# Patient Record
Sex: Male | Born: 1945 | Race: White | Hispanic: No | Marital: Married | Smoking: Former smoker
Health system: Southern US, Community
[De-identification: ages and names within clinical notes are randomized; demographics above are authoritative.]

## PROBLEM LIST (undated history)

## (undated) DIAGNOSIS — T148XXA Other injury of unspecified body region, initial encounter: Secondary | ICD-10-CM

## (undated) DIAGNOSIS — I2109 ST elevation (STEMI) myocardial infarction involving other coronary artery of anterior wall: Secondary | ICD-10-CM

## (undated) DIAGNOSIS — F329 Major depressive disorder, single episode, unspecified: Secondary | ICD-10-CM

## (undated) DIAGNOSIS — I519 Heart disease, unspecified: Secondary | ICD-10-CM

## (undated) DIAGNOSIS — F32A Depression, unspecified: Secondary | ICD-10-CM

## (undated) DIAGNOSIS — I2510 Atherosclerotic heart disease of native coronary artery without angina pectoris: Secondary | ICD-10-CM

## (undated) HISTORY — PX: CORONARY ARTERY BYPASS GRAFT: SHX141

## (undated) HISTORY — DX: Atherosclerotic heart disease of native coronary artery without angina pectoris: I25.10

## (undated) HISTORY — PX: CARDIAC CATHETERIZATION: SHX172

## (undated) HISTORY — DX: Depression, unspecified: F32.A

## (undated) HISTORY — DX: Heart disease, unspecified: I51.9

## (undated) HISTORY — PX: TONSILLECTOMY: SUR1361

## (undated) HISTORY — DX: Major depressive disorder, single episode, unspecified: F32.9

## (undated) HISTORY — DX: ST elevation (STEMI) myocardial infarction involving other coronary artery of anterior wall: I21.09

## (undated) HISTORY — PX: OTHER SURGICAL HISTORY: SHX169

## (undated) HISTORY — DX: Other injury of unspecified body region, initial encounter: T14.8XXA

---

## 2000-06-17 ENCOUNTER — Other Ambulatory Visit: Admission: RE | Admit: 2000-06-17 | Discharge: 2000-06-17 | Payer: Self-pay | Admitting: Gastroenterology

## 2010-08-19 ENCOUNTER — Inpatient Hospital Stay (HOSPITAL_COMMUNITY)
Admission: EM | Admit: 2010-08-19 | Discharge: 2010-08-24 | DRG: 107 | Disposition: A | Discharge status: Home / Self Care | Payer: BC Managed Care – PPO | Attending: Family Medicine | Admitting: Family Medicine

## 2010-08-19 DIAGNOSIS — E785 Hyperlipidemia, unspecified: Secondary | ICD-10-CM | POA: Diagnosis present

## 2010-08-19 DIAGNOSIS — R110 Nausea: Secondary | ICD-10-CM | POA: Diagnosis not present

## 2010-08-19 DIAGNOSIS — I2510 Atherosclerotic heart disease of native coronary artery without angina pectoris: Secondary | ICD-10-CM

## 2010-08-19 DIAGNOSIS — D62 Acute posthemorrhagic anemia: Secondary | ICD-10-CM | POA: Diagnosis not present

## 2010-08-19 DIAGNOSIS — I2109 ST elevation (STEMI) myocardial infarction involving other coronary artery of anterior wall: Principal | ICD-10-CM | POA: Diagnosis present

## 2010-08-19 DIAGNOSIS — F1010 Alcohol abuse, uncomplicated: Secondary | ICD-10-CM | POA: Diagnosis present

## 2010-08-19 DIAGNOSIS — I10 Essential (primary) hypertension: Secondary | ICD-10-CM | POA: Diagnosis present

## 2010-08-19 DIAGNOSIS — Z01810 Encounter for preprocedural cardiovascular examination: Secondary | ICD-10-CM

## 2010-08-19 DIAGNOSIS — D696 Thrombocytopenia, unspecified: Secondary | ICD-10-CM | POA: Diagnosis not present

## 2010-08-19 DIAGNOSIS — E8779 Other fluid overload: Secondary | ICD-10-CM | POA: Diagnosis not present

## 2010-08-19 DIAGNOSIS — Z79899 Other long term (current) drug therapy: Secondary | ICD-10-CM

## 2010-08-19 DIAGNOSIS — Z8249 Family history of ischemic heart disease and other diseases of the circulatory system: Secondary | ICD-10-CM

## 2010-08-19 HISTORY — DX: ST elevation (STEMI) myocardial infarction involving other coronary artery of anterior wall: I21.09

## 2010-08-19 LAB — CBC
HCT: 42.4 % (ref 39.0–52.0)
MCHC: 34.2 g/dL (ref 30.0–36.0)
Platelets: 169 10*3/uL (ref 150–400)
RDW: 13.5 % (ref 11.5–15.5)
WBC: 7.4 10*3/uL (ref 4.0–10.5)

## 2010-08-19 LAB — CARDIAC PANEL(CRET KIN+CKTOT+MB+TROPI)
CK, MB: 88.5 ng/mL (ref 0.3–4.0)
Total CK: 687 U/L — ABNORMAL HIGH (ref 7–232)
Troponin I: 6.25 ng/mL (ref 0.00–0.06)

## 2010-08-19 LAB — COMPREHENSIVE METABOLIC PANEL
Alkaline Phosphatase: 77 U/L (ref 39–117)
BUN: 7 mg/dL (ref 6–23)
Creatinine, Ser: 1.1 mg/dL (ref 0.4–1.5)
Glucose, Bld: 111 mg/dL — ABNORMAL HIGH (ref 70–99)
Potassium: 3.8 mEq/L (ref 3.5–5.1)
Total Protein: 6.5 g/dL (ref 6.0–8.3)

## 2010-08-19 LAB — BLOOD GAS, ARTERIAL
Drawn by: 23590
O2 Saturation: 98.1 %
Patient temperature: 98.6

## 2010-08-19 LAB — URINALYSIS, ROUTINE W REFLEX MICROSCOPIC
Protein, ur: NEGATIVE mg/dL
Urobilinogen, UA: 0.2 mg/dL (ref 0.0–1.0)

## 2010-08-19 LAB — BRAIN NATRIURETIC PEPTIDE: Pro B Natriuretic peptide (BNP): 195 pg/mL — ABNORMAL HIGH (ref 0.0–100.0)

## 2010-08-19 LAB — HEMOGLOBIN A1C
Hgb A1c MFr Bld: 5.3 % (ref <5.7)
Mean Plasma Glucose: 105 mg/dL (ref <117)

## 2010-08-19 LAB — PROTIME-INR
INR: 1.06 (ref 0.00–1.49)
Prothrombin Time: 14 seconds (ref 11.6–15.2)

## 2010-08-19 LAB — APTT: aPTT: 181 seconds — ABNORMAL HIGH (ref 24–37)

## 2010-08-19 LAB — MRSA PCR SCREENING: MRSA by PCR: NEGATIVE

## 2010-08-20 ENCOUNTER — Inpatient Hospital Stay (HOSPITAL_COMMUNITY): Payer: BC Managed Care – PPO

## 2010-08-20 DIAGNOSIS — I2510 Atherosclerotic heart disease of native coronary artery without angina pectoris: Secondary | ICD-10-CM

## 2010-08-20 LAB — POCT I-STAT 3, ART BLOOD GAS (G3+)
Acid-base deficit: 2 mmol/L (ref 0.0–2.0)
Acid-base deficit: 4 mmol/L — ABNORMAL HIGH (ref 0.0–2.0)
Acid-base deficit: 1 mmol/L (ref 0.0–2.0)
Bicarbonate: 23 mEq/L (ref 20.0–24.0)
Bicarbonate: 24.5 mEq/L — ABNORMAL HIGH (ref 20.0–24.0)
O2 Saturation: 100 %
O2 Saturation: 99 %
O2 Saturation: 98 %
O2 Saturation: 97 %
O2 Saturation: 97 %
Patient temperature: 35.3
TCO2: 27 mmol/L (ref 0–100)
TCO2: 25 mmol/L (ref 0–100)
TCO2: 23 mmol/L (ref 0–100)
TCO2: 24 mmol/L (ref 0–100)
pCO2 arterial: 31.3 mmHg — ABNORMAL LOW (ref 35.0–45.0)
pCO2 arterial: 36.7 mmHg (ref 35.0–45.0)
pCO2 arterial: 49.2 mmHg — ABNORMAL HIGH (ref 35.0–45.0)
pCO2 arterial: 45.3 mmHg — ABNORMAL HIGH (ref 35.0–45.0)
pH, Arterial: 7.451 — ABNORMAL HIGH (ref 7.350–7.450)
pO2, Arterial: 277 mmHg — ABNORMAL HIGH (ref 80.0–100.0)
pO2, Arterial: 100 mmHg (ref 80.0–100.0)
pO2, Arterial: 100 mmHg (ref 80.0–100.0)

## 2010-08-20 LAB — POCT I-STAT, CHEM 8
BUN: 8 mg/dL (ref 6–23)
BUN: 9 mg/dL (ref 6–23)
Calcium, Ion: 1.14 mmol/L (ref 1.12–1.32)
Chloride: 107 mEq/L (ref 96–112)
Chloride: 108 mEq/L (ref 96–112)
Creatinine, Ser: 1.1 mg/dL (ref 0.4–1.5)
Glucose, Bld: 117 mg/dL — ABNORMAL HIGH (ref 70–99)
Glucose, Bld: 115 mg/dL — ABNORMAL HIGH (ref 70–99)
Potassium: 3.8 mEq/L (ref 3.5–5.1)
Potassium: 4.8 mEq/L (ref 3.5–5.1)

## 2010-08-20 LAB — POCT I-STAT 4, (NA,K, GLUC, HGB,HCT)
Glucose, Bld: 113 mg/dL — ABNORMAL HIGH (ref 70–99)
Glucose, Bld: 101 mg/dL — ABNORMAL HIGH (ref 70–99)
Glucose, Bld: 92 mg/dL (ref 70–99)
HCT: 34 % — ABNORMAL LOW (ref 39.0–52.0)
HCT: 27 % — ABNORMAL LOW (ref 39.0–52.0)
Hemoglobin: 10.9 g/dL — ABNORMAL LOW (ref 13.0–17.0)
Hemoglobin: 9.2 g/dL — ABNORMAL LOW (ref 13.0–17.0)
Hemoglobin: 11.2 g/dL — ABNORMAL LOW (ref 13.0–17.0)
Potassium: 3.9 mEq/L (ref 3.5–5.1)
Potassium: 5.5 mEq/L — ABNORMAL HIGH (ref 3.5–5.1)
Potassium: 3.9 mEq/L (ref 3.5–5.1)
Sodium: 141 mEq/L (ref 135–145)
Sodium: 137 mEq/L (ref 135–145)
Sodium: 140 mEq/L (ref 135–145)

## 2010-08-20 LAB — COMPREHENSIVE METABOLIC PANEL
Albumin: 3.1 g/dL — ABNORMAL LOW (ref 3.5–5.2)
Alkaline Phosphatase: 68 U/L (ref 39–117)
BUN: 6 mg/dL (ref 6–23)
Chloride: 108 mEq/L (ref 96–112)
Potassium: 4.3 mEq/L (ref 3.5–5.1)
Total Bilirubin: 1.1 mg/dL (ref 0.3–1.2)

## 2010-08-20 LAB — CARDIAC PANEL(CRET KIN+CKTOT+MB+TROPI)
Total CK: 870 U/L — ABNORMAL HIGH (ref 7–232)
Troponin I: 20.37 ng/mL (ref 0.00–0.06)

## 2010-08-20 LAB — CBC
HCT: 38.9 % — ABNORMAL LOW (ref 39.0–52.0)
HCT: 32.4 % — ABNORMAL LOW (ref 39.0–52.0)
HCT: 34.8 % — ABNORMAL LOW (ref 39.0–52.0)
Hemoglobin: 13.3 g/dL (ref 13.0–17.0)
Hemoglobin: 10.8 g/dL — ABNORMAL LOW (ref 13.0–17.0)
Hemoglobin: 11.7 g/dL — ABNORMAL LOW (ref 13.0–17.0)
MCHC: 34.2 g/dL (ref 30.0–36.0)
MCHC: 33.3 g/dL (ref 30.0–36.0)
MCHC: 33.6 g/dL (ref 30.0–36.0)
MCV: 87.4 fL (ref 78.0–100.0)
RBC: 3.75 MIL/uL — ABNORMAL LOW (ref 4.22–5.81)
RDW: 13.5 % (ref 11.5–15.5)
WBC: 7.9 10*3/uL (ref 4.0–10.5)
WBC: 8.9 10*3/uL (ref 4.0–10.5)

## 2010-08-20 LAB — POCT I-STAT 3, VENOUS BLOOD GAS (G3P V)
Bicarbonate: 24.1 mEq/L — ABNORMAL HIGH (ref 20.0–24.0)
pH, Ven: 7.465 — ABNORMAL HIGH (ref 7.250–7.300)
pO2, Ven: 31 mmHg (ref 30.0–45.0)

## 2010-08-20 LAB — PROTIME-INR: INR: 1.3 (ref 0.00–1.49)

## 2010-08-20 LAB — PLATELET COUNT: Platelets: 115 10*3/uL — ABNORMAL LOW (ref 150–400)

## 2010-08-20 LAB — LIPID PANEL
LDL Cholesterol: 47 mg/dL (ref 0–99)
VLDL: 44 mg/dL — ABNORMAL HIGH (ref 0–40)

## 2010-08-20 LAB — SURGICAL PCR SCREEN: MRSA, PCR: NEGATIVE

## 2010-08-20 LAB — CREATININE, SERUM
Creatinine, Ser: 1.17 mg/dL (ref 0.4–1.5)
GFR calc non Af Amer: >60 mL/min (ref >60)

## 2010-08-20 LAB — HEPARIN LEVEL (UNFRACTIONATED): Heparin Unfractionated: 0.25 IU/mL — ABNORMAL LOW (ref 0.30–0.70)

## 2010-08-21 ENCOUNTER — Inpatient Hospital Stay (HOSPITAL_COMMUNITY): Payer: BC Managed Care – PPO

## 2010-08-21 DIAGNOSIS — I2109 ST elevation (STEMI) myocardial infarction involving other coronary artery of anterior wall: Secondary | ICD-10-CM

## 2010-08-21 LAB — BASIC METABOLIC PANEL
CO2: 25 mEq/L (ref 19–32)
Calcium: 7.9 mg/dL — ABNORMAL LOW (ref 8.4–10.5)
Creatinine, Ser: 1.2 mg/dL (ref 0.4–1.5)
GFR calc Af Amer: >60 mL/min (ref >60)
GFR calc non Af Amer: >60 mL/min (ref >60)
Sodium: 142 mEq/L (ref 135–145)

## 2010-08-21 LAB — GLUCOSE, CAPILLARY
Glucose-Capillary: 107 mg/dL — ABNORMAL HIGH (ref 70–99)
Glucose-Capillary: 108 mg/dL — ABNORMAL HIGH (ref 70–99)
Glucose-Capillary: 110 mg/dL — ABNORMAL HIGH (ref 70–99)
Glucose-Capillary: 139 mg/dL — ABNORMAL HIGH (ref 70–99)
Glucose-Capillary: 143 mg/dL — ABNORMAL HIGH (ref 70–99)

## 2010-08-21 LAB — CBC
Hemoglobin: 10.8 g/dL — ABNORMAL LOW (ref 13.0–17.0)
MCH: 29.3 pg (ref 26.0–34.0)
MCHC: 33.5 g/dL (ref 30.0–36.0)
Platelets: 121 10*3/uL — ABNORMAL LOW (ref 150–400)

## 2010-08-22 ENCOUNTER — Inpatient Hospital Stay (HOSPITAL_COMMUNITY): Payer: BC Managed Care – PPO

## 2010-08-22 DIAGNOSIS — I2510 Atherosclerotic heart disease of native coronary artery without angina pectoris: Secondary | ICD-10-CM

## 2010-08-22 LAB — BASIC METABOLIC PANEL
CO2: 25 mEq/L (ref 19–32)
Calcium: 8.4 mg/dL (ref 8.4–10.5)
Creatinine, Ser: 1.17 mg/dL (ref 0.4–1.5)
GFR calc Af Amer: >60 mL/min (ref >60)
GFR calc non Af Amer: >60 mL/min (ref >60)
Sodium: 141 mEq/L (ref 135–145)

## 2010-08-22 LAB — CBC
MCH: 29.5 pg (ref 26.0–34.0)
MCHC: 33.7 g/dL (ref 30.0–36.0)
Platelets: 125 10*3/uL — ABNORMAL LOW (ref 150–400)
RDW: 13.8 % (ref 11.5–15.5)

## 2010-08-22 NOTE — Procedures (Signed)
  NAME:  Joshua Mcpherson, SCHUYLER NO.:  [id number]  MEDICAL RECORD NO.:  [id number]          PATIENT TYPE:  INP  LOCATION:  2905                         FACILITY:  [facility]  PHYSICIAN:  Renton Berkley M. [place], M.D.  DATE OF BIRTH:  09-10-1945  DATE OF PROCEDURE:  08/19/2010 DATE OF DISCHARGE:                           CARDIAC CATHETERIZATION   INDICATIONS FOR PROCEDURE:  A 65 year old white male with no prior cardiac history who presents with a 2-day history of chest pain.  ECG suggests evidence of anterior septal infarction with persistent ST elevation of 1-2 mm in leads V2 but also with Q waves.  PROCEDURES:  Left heart catheterization, coronary and left ventricular angiography.  ACCESS:  Via the right radial artery using standard Seldinger technique.  EQUIPMENT:  A 6-French 4 cm right Judkins catheter, 6-French Kimny catheter, 6-French pigtail catheter, 6-French arterial sheath.  MEDICATIONS:  Local anesthesia 1% Xylocaine, Versed 2 mg IV, fentanyl 25 mcg IV, verapamil 3 mg intra-arterial, and heparin 4000 units IV.  CONTRAST:  120 mL of Omnipaque.  HEMODYNAMIC DATA:  Aortic pressure was 138/78 with mean of 106 mmHg, left ventricle pressure is 138 with EDP of 32 mmHg.  ANGIOGRAPHIC DATA:  Noteworthy that the left main was very difficult to cannulate.  We finally cannulated with a Kimny Guide.  Remainder of the procedure was without difficulty.  The right coronary artery arises and distributes normally.  There is 20% narrowing in the midvessel.  He gives rise to faint collaterals to the septal perforators.  The left main coronary artery has a 40-50% ostial stenosis.  The left anterior descending artery is a large vessel, which extends out around the apex.  He has a 95% proximal stenosis followed by 50% disease prior to the takeoff of the second diagonal branch.  The mid to distal LAD tapers to a smaller caliber.  There is a very large diagonal branch, which has a long  90% stenosis proximally.  Left circumflex coronary has minor disease proximally up to 20-30%.  Left ventricular angiography was performed in the RAO view.  This demonstrates severe anterolateral hypokinesia and apical dyskinesia with overall ejection fraction estimated 35-40%.  There is no significant mitral insufficiency.  FINAL IMPRESSION: 1. Severe two-vessel obstructive atherosclerotic coronary artery     disease with modest ostial left main disease.  These lesions in the     left anterior descending and diagonal are very complex. 2. Moderate left ventricular dysfunction.  PLAN:  The patient is currently painfree.  He will be transferred to the intensive care unit for aggressive medical management.  We will obtain CT surgery consult for coronary artery bypass surgery.          ______________________________ Tilmon Wisehart M. [place], M.D.     PMJ/MEDQ  D:  08/19/2010  T:  08/20/2010  Job:  [id number]  cc:   Antony Madura, M.D.  Electronically Signed by Trenda Corliss [place]and M.D. on 08/22/2010 04:00:08 PM

## 2010-08-23 LAB — CROSSMATCH

## 2010-08-23 LAB — CBC
Hemoglobin: 10.8 g/dL — ABNORMAL LOW (ref 13.0–17.0)
MCH: 29.5 pg (ref 26.0–34.0)
Platelets: 131 10*3/uL — ABNORMAL LOW (ref 150–400)
RBC: 3.66 MIL/uL — ABNORMAL LOW (ref 4.22–5.81)

## 2010-08-23 LAB — BASIC METABOLIC PANEL
CO2: 24 mEq/L (ref 19–32)
Calcium: 8.6 mg/dL (ref 8.4–10.5)
Chloride: 103 mEq/L (ref 96–112)
Creatinine, Ser: 1.24 mg/dL (ref 0.4–1.5)
GFR calc Af Amer: >60 mL/min (ref >60)
Sodium: 141 mEq/L (ref 135–145)

## 2010-08-25 NOTE — Consult Note (Signed)
NAME:  Joshua Mcpherson, Joshua Mcpherson NO.:  [id number]  MEDICAL RECORD NO.:  [id number]           PATIENT TYPE:  I  LOCATION:  2304                         FACILITY:  [facility]  PHYSICIAN:  Evelene Croon, M.D.     DATE OF BIRTH:  10/08/1945  DATE OF CONSULTATION:  08/19/2010 DATE OF DISCHARGE:                                CONSULTATION   REFERRING PHYSICIAN:  Peter M. [place]iland, MD.  REASON FOR CONSULTATION:  Left main and severe single-vessel coronary artery disease status post ST-segment elevation MI.  CLINICAL HISTORY:  I was asked by Dr. Swaziland to evaluate Joshua Mcpherson for consideration of coronary bypass graft surgery.  He is a 65 year old gentleman with no prior cardiac history who presented with a 2-day history of stuttering chest pain.  He had never had these symptoms before.  Earlier today, he developed recurrent chest pain which was severe and did not resolve as it had been and therefore he was seen in Dr. Burton Apley' office.  An electrocardiogram showed anterolateral ST-segment elevation.  The patient was transferred to the [facility] Emergency Room and taken to cath lab as a code STEMI.  Cardiac catheterization showed left main and 40-50% ostial stenosis.  The LAD had 95% proximal stenosis followed by 50% stenosis.  There is a large first diagonal that had a long 90% proximal stenosis.  Left circumflex had 30% proximal stenosis with a single marginal branch.  The right coronary had about 20% midvessel stenosis and was a large dominant vessel.  Left ventricular ejection fraction was about 35-40% with severe anterolateral hypokinesis and apical dyskinesis.  The patient still has some chest pain in the cath lab and then became pain-free on heparin and nitroglycerin.  REVIEW OF SYSTEMS:  GENERAL:  He denies any fever or chills.  He has had no recent weight changes.  He has had recent fatigue.  EYES:  Negative. ENT:  Negative.  ENDOCRINE:  He denies diabetes and  hypothyroidism. CARDIOVASCULAR:  As above.  He denies PND or orthopnea.  He has had some exertional dyspnea.  He denies peripheral edema and palpitations. RESPIRATORY:  He denies cough and sputum production.  GI:  He has had no nausea or vomiting.  He denies melena and bright red blood per rectum. GU:  He denies dysuria and hematuria.  MUSCULOSKELETAL:  He denies arthralgias and myalgias.  NEUROLOGICAL:  He denies any focal weakness or numbness.  He denies dizziness and syncope.  He has never had TIA or stroke.  HEMATOLOGICAL:  Negative.  ALLERGIES:  None.  MEDICATIONS:  None.  PAST MEDICAL HISTORY:  Significant for history of multiple fractures. He is status post tonsillectomy.  He does not have a regular physician who follows him.  SOCIAL HISTORY:  He lives in [place] with his wife.  He has two adult sons.  He is a retired Furniture conservator/restorer.  He does drink some alcohol.  He is a previous smoker.  He plays golf at least 2 days a week.  FAMILY HISTORY:  His father is alive in his 67s and had myocardial infarction age 83 and coronary bypass surgery.  His two  brothers are alive and well.  Mother and history of pacemaker placement and is alive at age 50.  PHYSICAL EXAMINATION:  VITAL SIGNS:  Blood pressure is 135/80, pulse is 70 and regular, respiratory rate 16 and unlabored. GENERAL:  He is an anxious white male in no distress. HEENT:  Normocephalic and atraumatic.  Pupils are equal and reactive to light and accommodation.  Extraocular muscles are intact.  Throat is clear. NECK:  Normal carotid pulses bilaterally.  There are no bruits.  There is no adenopathy or thyromegaly. CARDIAC:  Regular rate and rhythm with normal S1 and S2.  There is no murmur, rub, or gallop. LUNGS:  Clear. ABDOMEN:  Active bowel sounds.  His abdomen is soft and nontender. There are no palpable masses or organomegaly. EXTREMITIES:  No peripheral edema.  Pedal pulses are palpable bilaterally.   His skin is warm and dry. NEUROLOGIC:  Alert and oriented x3.  Motor and sensory exams are grossly normal.  LABORATORY EXAMINATION:  His initial CPK was 687 with an MB 88.59, troponin of 6.25.  His second set of enzymes showed a CPK of 883 with an MB of 117 and a troponin of 18.13.  His third set of enzyme showed a CPK of 870 with an MB of 103 and a troponin of 20.  His fasting lipid profile showed total cholesterol of 123, HDL that was low at 32, and LDL that was 47, and a triglyceride of 218.  His hemoglobin A1c was normal at 5.3 with a mean plasma glucose of 105.  TSH was within normal range at 1.536.  His MRSA PCR screen was negative.  IMPRESSION:  Joshua Mcpherson has moderate left main and severe left anterior descending and diagonal coronary disease presenting with an acute ST-segment elevation myocardial infarction.  He is now pain-free. I agree that coronary bypass graft surgery is the best treatment to prevent further ischemia and infarction.  I discussed the operative procedure with the patient and his family including alternatives, benefits, and risks including but not limited to bleeding, blood transfusion, infection, stroke, myocardial infarction, graft failure, and death.  He understands and agrees to proceed.  We will plan to do the surgery on Wednesday, August 20, 2010.     Evelene Croon, M.D.     BB/MEDQ  D:  08/20/2010  T:  08/21/2010  Job:  [id number]  cc:   Peter M. [place], M.D.  Electronically Signed by Evelene Croon M.D. on 08/25/2010 08:25:45 AM

## 2010-08-25 NOTE — Op Note (Signed)
NAME:  IAM, LIPSON NO.:  [id number]  MEDICAL RECORD NO.:  [id number]           PATIENT TYPE:  I  LOCATION:  2304                         FACILITY:  [facility]  PHYSICIAN:  Evelene Croon, M.D.     DATE OF BIRTH:  08/02/1945  DATE OF PROCEDURE:  08/20/2010 DATE OF DISCHARGE:                              OPERATIVE REPORT   PREOPERATIVE DIAGNOSIS:  Left main and severe single-vessel coronary artery disease, status post ST-segment elevation myocardial infarction.  POSTOPERATIVE DIAGNOSIS:  Left main and severe single-vessel coronary artery disease, status post ST-segment elevation myocardial infarction.  OPERATIVE PROCEDURE:  Median sternotomy, extracorporeal circulation, coronary artery bypass graft surgery x3 using a left internal mammary artery graft to left anterior descending coronary artery, saphenous vein graft to diagonal branch of the left anterior descending, and a saphenous vein graft to the obtuse marginal branch of the left circumflex coronary artery.  Endoscopic vein harvesting from the right leg.  ATTENDING SURGEON:  Evelene Croon, MD  ASSISTANT:  Rowe Clack, PA-C  ANESTHESIA:  General endotracheal.  CLINICAL HISTORY:  This patient is a 65 year old gentleman who presented with a 2-day history of stuttering chest pain.  He ruled in for an ST- segment elevation MI with anterolateral ST elevation on his EKG and initial troponin of 6.  He was taken to cath lab as a code STEMI.  This showed a 46% ostial left main stenosis.  The LAD had 95% proximal stenosis followed by 50% stenosis.  There was large first diagonal that had 90% long proximal stenosis.  Left circumflex had 30% proximal disease with a single marginal branch.  The right coronary artery was a large dominant vessel with about 20% midvessel stenosis.  The left ventricular ejection fraction was 35-40% with severe anterolateral hypokinesis and apical dyskinesis.  After review of the  catheterization and examination of the patient, it was felt that coronary artery bypass graft surgery is the best treatment to prevent further ischemia and infarction.  I discussed the operative procedure with the patient and his family including alternatives, benefits, and risks including but not limited to bleeding, blood transfusion, infection, stroke, myocardial infarction, graft failure, and death.  He understood and agreed to proceed.  OPERATIVE PROCEDURE:  The patient was taken to the operating room, placed on table in supine position.  After induction of general endotracheal anesthesia, a Foley catheter was placed in bladder using sterile technique.  Then the chest, abdomen, and both lower extremities were prepped and draped in usual sterile manner.  The chest was entered through a median sternotomy incision.  The pericardium opened midline. Examination of the heart showed good right ventricular contractility. The ascending aorta was of normal size and had no palpable plaques in it.  There was a large amount epicardial fat.  Then, the left internal mammary artery was flushed from chest wall as pedicle graft.  This was a medium caliber vessel with excellent blood flow through it.  At the same time, a segment of greater saphenous vein was harvested from the right leg using endoscopic vein harvest technique.  This vein was of medium size and good quality.  Then  the patient was heparinized when an adequate ACT was obtained.  The distal ascending aorta was cannulated using a 20-French aortic cannula for arterial inflow.  Venous outflow was achieved using a two-stage venous cannula through the right atrial appendage.  An antegrade cardioplegia and vent cannula was inserted in aortic root.  The patient was placed on cardiopulmonary bypass and the distal coronaries identified.  The LAD was intramyocardial un its proximal half.  The distal half came to the surface of the heart and was  a medium- sized graftable vessel.  The first diagonal branch was completely intramyocardial.  It was located proximally just beyond the area of stenosis where there was a large graftable vessel.  The obtuse marginal was a medium-sized graftable vessel.  Then, the aorta was crossclamped and 1000 mL of cold blood antegrade cardioplegia was administered in the aortic root with quick arrest of the heart.  Systemic hypothermia to 28 degrees centigrade and topical hypothermic iced saline was used.  A temperature probe was placed in the septum insulating pad in the pericardium.  The first distal anastomosis was then performed to the diagonal branch. The internal diameter of this vessel was about 2 mm.  This vessel was probed distally and was quite deep in intramyocardial location.  The conduit used was a segment of greater saphenous vein and the anastomosis performed in an end-to-side manner using continuous 7-0 Prolene suture. Flow was noted through the graft was excellent.  The second distal anastomosis was performed to the obtuse marginal branch.  The internal diameter of this vessel was about 1.6 mm.  Conduit used was a second segment of greater saphenous vein and the anastomosis was performed in an end-to-side manner using continuous 7-0 Prolene suture.  Flow was noted through the graft was excellent.  Then another dose of cardioplegia was given down the vein grafts and aortic root.  The third distal anastomosis was performed in the distal LAD.  The internal diameter here was about 1.6 mm.  The conduit used was the left internal mammary graft that was brought through an opening left pericardium anterior to the phrenic nerve.  This anastomosed to the LAD in end-to-side manner using continuous 8-0 Prolene suture.  The pedicle was sutured to the epicardium with 6-0 Prolene sutures.  Then, with the crossclamp in place, two proximal vein graft anastomoses were performed.  The mid ascending  aorta in end-to-side manner using continuous 6-0 Prolene suture.  Then, the clamp was removed from mammary pedicle.  There was rapid warming of the ventricular septum and return of spontaneous ventricular fibrillation.  The crossclamp was removed with the time of 75 minutes and there was spontaneous return of sinus rhythm.  The proximal and distal anastomoses appeared hemostatic and while the grafts satisfactory.  Graft markers placed around the proximal anastomoses.  Two temporary right ventricular and right atrial pacing wires were placed and brought out through the skin.  When the patient rewarmed to 37 degrees centigrade, he was weaned from cardiopulmonary bypass on low-dose dopamine at 3 mcg/kg per minute. Cardiac function appeared good with a cardiac output of 5.5 L per minute.  Protamine was given and the venous and aortic cannulas were removed without difficulty.  Hemostasis was achieved.  Three chest tubes were placed, two in the posterior pericardium, one in the left pleural space, and one in the anterior mediastinum.  Sternum was then closed with double #6 stainless steel wires.  The fascia was closed with continuous #1 Vicryl suture.  Subcutaneous  tissue was closed with continuous 2-0 Vicryl and the skin with a 3-0 Vicryl subcuticular closure.  The lower extremity vein harvest site was closed in layers in a similar manner.  The sponge, needle, and instrument counts were correct according to the scrub nurse.  Dry sterile dressing was applied over the incisions around the chest tubes with Pleur-Evac suction.  The patient remained hemodynamically stable and transferred to the SICU in guarded stable condition.     Evelene Croon, M.D.     BB/MEDQ  D:  08/20/2010  T:  08/21/2010  Job:  [id number]  cc:   Peter M. [place], M.D.  Electronically Signed by Evelene Croon M.D. on 08/25/2010 08:25:42 AM

## 2010-09-03 NOTE — H&P (Signed)
NAME:  Joshua Mcpherson, Joshua Mcpherson NO.:  [id number]  MEDICAL RECORD NO.:  [id number]          PATIENT TYPE:  INP  LOCATION:  2905                         FACILITY:  [facility]  PHYSICIAN:  Peter M. Sw[place]iland, M.D.  DATE OF BIRTH:  01/27/1946  DATE OF ADMISSION:  08/19/2010 DATE OF DISCHARGE:                             HISTORY & PHYSICAL   PRIMARY CARDIOLOGIST:  New to [facility], being seen by Dr. Swaziland.  PRIMARY CARE DOCTOR:  Antony Madura, MD  PATIENT PROFILE:  A 65 year old male without prior cardiac history who presents as possible ST-elevation MI with 2-day history of intermittent chest pain.  PROBLEMS: 1. Possible acute anterior ST-elevation myocardial infarction. 2. Status post tonsillectomy. 3. History of multiple fractures in his youth.  ALLERGIES:  No known drug allergies.  HISTORY OF PRESENT ILLNESS:  A 65 year old male without prior cardiac history.  He was in his usual state of health approximately 2 days ago and developed intermittent resting and exertional substernal chest pressure with associated weakness, fatigue, anorexia.  He saw his son-in- law today who is Dr. Burton Apley and has reported complaints of chest pain.  ECG was performed suggestive of ST elevation, although this is difficult to interpret due to style of EKG normal.  ECG was reviewed by Dr. Elease Hashimoto in our office and the patient last presented  to the ED.  We were alerted of possible code STEMI and the patient was taken to the [facility] ED and subsequently straight to the cath lab.  The patient continues to complain of 3/10 chest pain.  HOME MEDICATIONS:  None.  FAMILY HISTORY:  Mother has a history of permanent pacemaker and she is alive at age 9.  Father is alive in the 18s, having had an MI at 15. The patient has two brothers who are alive and well.  SOCIAL HISTORY:  The patient lives in [place] with his wife.  He has two grown sons and four grandchildren.  He is a  retired Furniture conservator/restorer.  He drinks fairly heavily throughout the week, saying he will drink anything that is in front of him, and sometimes while he is playing golf can drink 6- to 12-pack of beer at a time.  He previously smoked in his youth but quit at the age of 33.  Denies drug use.  He tries to stay active and plays golf regularly.  REVIEW OF SYSTEMS:  Positive for chest pain, fatigue, weakness, poor appetite.  He is a full code.  Otherwise, all systems reviewed and negative.  PHYSICAL EXAMINATION:  VITAL SIGNS:  The patient is afebrile, heart rate 70, respirations 16, blood pressure 151/90, pulse ox 100% on 2 L. GENERAL:  Pleasant white male in no acute distress.  Awake, alert, oriented x3.  He has a normal affect. HEENT:  Normal.  Nares grossly intact and nonfocal. SKIN:  Warm and dry without lesions or masses. NECK:  Supple of bruits or JVD. LUNGS:  Respirations regular and unlabored.  Clear to auscultation. CARDIAC:  Regular S1 and S2.  No S3, S4, murmurs. ABDOMEN:  Round, soft, nontender, nondistended.  Bowel sounds present x4. EXTREMITIES:  Warm, dry, pink.  No clubbing, cyanosis, or edema. Dorsalis pedis and posterior tibial pulses 2+ and equal bilaterally.  He has a normal Allen test.  Chest x-ray is pending.  EKG shows sinus rhythm at a rate of 73, left axis.  He has a 1-mm ST elevation in lead V2 with inferior ST depression as well as depression in V6.  Lab work is pending.  ASSESSMENT AND PLAN: 1. Chest pain with concern for anterior ST-segment elevation.  The     patient is undergoing emergent catheterization for ongoing chest     pain.  Add aspirin, statin, beta-blocker plus/minus Effient     depending on outcome of cath. 2. Ethyl alcohol abuse.  We will add p.o. Ativan and CIWA protocol. 3. Hypertension.  Blood pressure is elevated in the setting of acute     events.  Continue to follow.  On beta-blocker therapy.     Nicolasa Ducking,  ANP   ______________________________ Peter M. [place], M.D.    CB/MEDQ  D:  08/19/2010  T:  08/20/2010  Job:  [id number]  Electronically Signed by Nicolasa Ducking ANP on 08/22/2010 04:25:08 PM Electronically Signed by PETER Swaziland M.D. on 09/03/2010 11:16:03 AM

## 2010-09-08 NOTE — Discharge Summary (Signed)
  NAME:  Joshua Mcpherson, Joshua Mcpherson           ACCOUNT NO.:  [id number]  MEDICAL RECORD NO.:  [id number]           PATIENT TYPE:  I  LOCATION:  2030                         FACILITY:  [facility]  PHYSICIAN:  Evelene Croon, M.D.     DATE OF BIRTH:  [birth year]  DATE OF ADMISSION:  08/19/2010 DATE OF DISCHARGE:  08/24/2010                              DISCHARGE SUMMARY   ADDENDUM:  On postop day #3, August 23, 2010, the patient was complaining of persistent nausea.  No vomiting.  Positive bowel movements.  His Lasix, potassium, and oxycodone discontinued at that time.  He had no edema noted and his weight was 91.1 kg, which was 1.1 kg above preoperatively.  Labs on that day showed white blood cell count 9.1, hemoglobin 10.8, hematocrit 32.0, platelet count 131.  Sodium of 141, potassium of 4.3, chloride of 103, bicarbonate 24, BUN of 15, creatinine 1.24, glucose 127.  On morning rounds of August 24, 2010, the patient states he is feeling much better.  After discontinuation of those meds, the patient had no more nausea.  He has been able to eat regular diet and tolerate it well.  He had several more bowel movements yesterday.  The patient states he is ready to go home today postop day #4 August 24, 2010.  He is up ambulating with assistance.  All incisions are clean, dry and intact and healing well.  Cardiology did add lisinopril to his medication regimen low dose.  He has maintained normal sinus rhythm and blood pressure is well controlled.  The patient is ready for discharge to home today postop day #4 August 24, 2010.  Please see dictated discharge summary for followup appointments and discharge instructions.  DISCHARGE MEDICATIONS: 1. Aspirin 325 mg daily. 2. Lisinopril 5 mg daily. 3. Lopressor 12.5 mg b.i.d. 4. Ultram 50 mg 1-2 tablets q.4 h. p.r.n. pain. 5. Maalox 1 capsule p.r.n. 6. Prilosec OTC daily. 7. Tums p.r.n.     Sol Blazing,  PA   ______________________________ Evelene Croon, M.D.   KMD/MEDQ  D:  08/24/2010  T:  08/25/2010  Job:  [id number]  cc:   Peter M. [place], M.D. Antony Madura, M.D.  Electronically Signed by Cameron Proud PA on 08/27/2010 09:44:04 AM Electronically Signed by Evelene Croon M.D. on 09/08/2010 01:31:51 PM

## 2010-09-08 NOTE — Discharge Summary (Signed)
NAME:  Joshua Mcpherson, Joshua Mcpherson NO.:  [id number]  MEDICAL RECORD NO.:  [id number]           PATIENT TYPE:  I  LOCATION:  2030                         FACILITY:  [facility]  PHYSICIAN:  Evelene Croon, M.D.     DATE OF BIRTH:  [birth year]  DATE OF ADMISSION:  08/19/2010 DATE OF DISCHARGE:                              DISCHARGE SUMMARY   HISTORY:  The patient is a 65 year old male without prior cardiac history who presented with a possible ST-segment elevation myocardial infarction with a 2-day history of intermittent chest pain.  The patient was in his usual state of health until approximately 2 days prior to admission when he developed intermittent resting and exertional substernal chest pressure with associated weakness, fatigue and anorexia.  He saw his son-in-law who is Dr. Burton Apley who reported his chest pain.  An EKG was performed suggestive of ST elevation, although it was uncertain for sure.  The EKG was reviewed by Dr. Elease Hashimoto and the patient was sent to the emergency department.  Dr. Swaziland was notified of a possible Code STEMI and the patient was brought straight to the Cardiac Catheterization Lab.  HOME MEDICATIONS:  None.  FAMILY HISTORY:  Mother has a history of permanent pacemaker and is alive at age 67.  Father is alive in his 75s.  He has a history of myocardial infarction at age 66.  He has two brothers who are alive and well.  SOCIAL HISTORY:  He lives in [place] with his wife.  He has two grown sons and four grandchildren.  He is a retired Furniture conservator/restorer.  He drinks fairly heavily throughout the week saying he will drink anything that is front of him and sometimes while he was playing golf, he could drinks 6-12 beers at one time.  He previously smoked, but quit at age 66.  He denies drug use.  He tries to stay active and plays golf regularly.  REVIEW OF SYMPTOMS:  See the history and physical.  PHYSICAL EXAMINATION:  Please see the  history and physical.  HOSPITAL COURSE:  The patient was brought to the Catheterization Lab where he underwent the left heart catheterization with coronary and left ventricular angiography.  He was found to have a left main coronary artery lesion of 48-50% in the ostium.  He had an LAD lesion with a 95% proximal stenosis followed by a 50% disease prior to the takeoff of the second diagonal branch.  There was also a very large diagonal branch with a 90% proximal stenosis.  Left circumflex had minor disease of up to approximately 20-30%.  The ejection fraction was estimated at 35-40% and there was findings consistent with anterolateral hypokinesia and apical dyskinesia on left ventricular angiography.  There was no significant mitral insufficiency.  Due to these findings, surgical consultation was then obtained with Evelene Croon, MD who evaluated the patient and studies and agreed with recommendations to proceed with surgical revascularization.  PROCEDURE:  On August 20, 2010, he underwent the following procedures;coronary artery bypass grafting x3, following grafts were placed.  Left internal mammary artery to the LAD, saphenous vein graft to the diagonal branch and  a saphenous vein graft to the obtuse marginal branch.  The patient tolerated the procedure well, was taken to the surgical intensive care unit in stable condition.  POSTOPERATIVE HOSPITAL COURSE:  The patient has overall done quite well. He has remained hemodynamically stable without significant cardiac dysrhythmias.  All routine lines were monitors and drainage devices have been discontinued in the standard fashion.  He was weaned from the ventilator without difficulty.  Inotropic support was weaned without difficulty.  He does have a mild acute blood loss anemia.  His values were stable.  Most recent hemoglobin and hematocrit dated August 22, 2010 are 10.6 and 31.5 respectively.  Electrolytes, BUN and creatinine are  within normal limits.  He did have a postoperative thrombocytopenia, but his values are stabilized and improving.  Most recent value was 125,000 on August 22, 2010.  He has a moderate volume overload, but is responding well to diuresis.  He is tolerating routine cardiac rehabilitation using standard protocols.  Oxygen has been weaned and he maintained good saturations on room air.  Incisions are healing well without evidence of infection.  Overall, he is felt to be tentatively stable for discharge in the next day or so pending further recovery.  MEDICATIONS AT DISCHARGE: 1. Aspirin 325 mg enteric-coated tablet once daily. 2. Lasix 40 mg daily for five additional days. 3. Lopressor 12.5 mg twice daily. 4. Oxycodone 5-10 mg q.4-6 h. p.r.n. for pain. 5. Potassium chloride 20 mEq daily for five days. 6. Maalox p.r.n. 7. Prilosec over-the-counter one capsule daily. 8. Tums p.r.n.  INSTRUCTIONS:  The patient received written instructions regarding medications, activity, diet, wound care, and followup.  FOLLOWUP:  Dr. Swaziland in 2 weeks post discharge.  Dr. Laneta Simmers on September 16, 2010 at 11 a.m. with a chest x-ray at that time.  FINAL DIAGNOSIS:  ST-segment elevation myocardial infarction with severe single-vessel coronary artery disease with moderate left main disease.  OTHER DIAGNOSES: 1. Hypertension. 2. History of heavy ethyl alcohol use. 3. Postoperative thrombocytopenia. 4. Postoperative acute blood loss anemia. 5. Postoperative volume overload.     Rowe Clack, P.A.-C.   ______________________________ Evelene Croon, M.D.    Sherryll Burger  D:  08/22/2010  T:  08/22/2010  Job:  [id number]  cc:   Antony Madura, M.D. Peter M. Swaziland, M.D.  Electronically Signed by Gershon Crane P.A.-C. on 08/28/2010 12:56:29 PM Electronically Signed by Evelene Croon M.D. on 09/08/2010 01:31:53 PM

## 2010-09-09 ENCOUNTER — Ambulatory Visit (INDEPENDENT_AMBULATORY_CARE_PROVIDER_SITE_OTHER): Payer: BC Managed Care – PPO | Admitting: *Deleted

## 2010-09-09 DIAGNOSIS — I2510 Atherosclerotic heart disease of native coronary artery without angina pectoris: Secondary | ICD-10-CM

## 2010-09-09 DIAGNOSIS — I214 Non-ST elevation (NSTEMI) myocardial infarction: Secondary | ICD-10-CM

## 2010-09-15 ENCOUNTER — Other Ambulatory Visit: Payer: Self-pay | Admitting: Surgery

## 2010-09-15 DIAGNOSIS — I2510 Atherosclerotic heart disease of native coronary artery without angina pectoris: Secondary | ICD-10-CM

## 2010-09-16 ENCOUNTER — Ambulatory Visit
Admission: RE | Admit: 2010-09-16 | Discharge: 2010-09-16 | Disposition: A | Discharge status: Home / Self Care | Payer: BC Managed Care – PPO | Source: Ambulatory Visit | Attending: Surgery | Admitting: Surgery

## 2010-09-16 ENCOUNTER — Encounter (INDEPENDENT_AMBULATORY_CARE_PROVIDER_SITE_OTHER): Payer: Self-pay | Admitting: Surgery

## 2010-09-16 DIAGNOSIS — I2510 Atherosclerotic heart disease of native coronary artery without angina pectoris: Secondary | ICD-10-CM

## 2010-09-16 NOTE — Assessment & Plan Note (Signed)
OFFICE VISIT  Joshua Mcpherson, Joshua Mcpherson DOB:  08-Apr-1946                                        September 16, 2010 CHART #:  [id number]  HISTORY:  Joshua Mcpherson returns to office today for followup status post coronary artery bypass graft surgery x3 on August 20, 2010.  He had an uncomplicated postoperative course.  Since discharge he said that he has had poor appetite.  He describes food not tasting very well and also has had a lot a problem with food texture.  He said whenever he tries to brush his teeth he ended up gagging.  He has had some chronic problems in the past with swallowing particularly pills.  His wife is concerned because he has lost weight.  He said he feels that his appetite is slowly improving.  He denies any mouth pain or painful swallowing.  He has had no abdominal complaints.  His bowels are moving normally.  He has been walking without chest pain or shortness of breath.  PHYSICAL EXAMINATION:  Blood pressure 102/64, pulse is 80 and regular, respiratory rate is 18 and unlabored.  Oxygen saturation on room air is 99%.  He looks well.  Cardiac exam shows regular rate and rhythm with normal heart sounds.  His lung exam is clear.  Chest incision is healing well and sternum is stable.  His leg incision is healing well.  There is no peripheral edema.  Followup chest x-ray shows clear lung fields and no pleural effusions.  His medications are; 1. Aspirin 325 mg daily. 2. Lisinopril 5 mg daily. 3. Lopressor 12.5 mg b.i.d. 4. Prilosec OTC daily. 5. Tums p.r.n. 6. Maalox p.r.n. 7. Crestor 5 mg daily, which was just started today. 8. Ultram p.r.n. for pain which he has not been taking since     discharge.  IMPRESSION:  Overall Joshua Mcpherson is making a satisfactory recovery following his surgery.  He has not clear why he has decreased appetite and difficulties with food textures at this time, but I suspect this will gradually improve.  His  wife said he has been mood depressed since going home from surgery.  Since he is not able to be as active as he was before and has not been play golf which he did religiously a couple of times per week.  I really suspect his appetite will gradually improve overtime.  I did tell me to return driving a car at this time but should refrain from lifting anything heavier than 10 pounds, horse riding, and golf club for 3 months postoperatively.  He is going to continue following up with Dr. Swaziland and Dr. Burton Apley and will contact me if develops any problems with his incisions.  Evelene Croon, M.D. Electronically Signed  BB/MEDQ  D:  09/16/2010  T:  09/16/2010  Job:  [id number]  cc:   Antony Madura, M.D. Peter M. Swaziland, M.D.

## 2010-09-19 ENCOUNTER — Ambulatory Visit (INDEPENDENT_AMBULATORY_CARE_PROVIDER_SITE_OTHER): Payer: BC Managed Care – PPO | Admitting: Nurse Practitioner

## 2010-09-19 DIAGNOSIS — R110 Nausea: Secondary | ICD-10-CM

## 2010-09-19 DIAGNOSIS — Z951 Presence of aortocoronary bypass graft: Secondary | ICD-10-CM

## 2010-10-01 ENCOUNTER — Inpatient Hospital Stay (HOSPITAL_COMMUNITY): Payer: BC Managed Care – PPO

## 2010-10-01 ENCOUNTER — Inpatient Hospital Stay (HOSPITAL_COMMUNITY)
Admission: AD | Admit: 2010-10-01 | Discharge: 2010-10-07 | DRG: 551 | Disposition: A | Discharge status: Home / Self Care | Payer: BC Managed Care – PPO | Source: Ambulatory Visit | Attending: Internal Medicine | Admitting: Internal Medicine

## 2010-10-01 DIAGNOSIS — Z6825 Body mass index (BMI) 25.0-25.9, adult: Secondary | ICD-10-CM

## 2010-10-01 DIAGNOSIS — Z951 Presence of aortocoronary bypass graft: Secondary | ICD-10-CM

## 2010-10-01 DIAGNOSIS — K259 Gastric ulcer, unspecified as acute or chronic, without hemorrhage or perforation: Secondary | ICD-10-CM | POA: Diagnosis present

## 2010-10-01 DIAGNOSIS — R7309 Other abnormal glucose: Secondary | ICD-10-CM | POA: Diagnosis present

## 2010-10-01 DIAGNOSIS — K2980 Duodenitis without bleeding: Secondary | ICD-10-CM | POA: Diagnosis present

## 2010-10-01 DIAGNOSIS — I2510 Atherosclerotic heart disease of native coronary artery without angina pectoris: Secondary | ICD-10-CM | POA: Diagnosis present

## 2010-10-01 DIAGNOSIS — E860 Dehydration: Secondary | ICD-10-CM | POA: Diagnosis present

## 2010-10-01 DIAGNOSIS — Z7982 Long term (current) use of aspirin: Secondary | ICD-10-CM

## 2010-10-01 DIAGNOSIS — K2990 Gastroduodenitis, unspecified, without bleeding: Principal | ICD-10-CM | POA: Diagnosis present

## 2010-10-01 DIAGNOSIS — I252 Old myocardial infarction: Secondary | ICD-10-CM

## 2010-10-01 DIAGNOSIS — K2970 Gastritis, unspecified, without bleeding: Principal | ICD-10-CM | POA: Diagnosis present

## 2010-10-01 DIAGNOSIS — E46 Unspecified protein-calorie malnutrition: Secondary | ICD-10-CM | POA: Diagnosis present

## 2010-10-01 LAB — BASIC METABOLIC PANEL
CO2: 22 mEq/L (ref 19–32)
Chloride: 100 mEq/L (ref 96–112)
GFR calc Af Amer: 59 mL/min — ABNORMAL LOW (ref >60)
Glucose, Bld: 108 mg/dL — ABNORMAL HIGH (ref 70–99)
Sodium: 135 mEq/L (ref 135–145)

## 2010-10-01 LAB — COMPREHENSIVE METABOLIC PANEL
ALT: 33 U/L (ref 0–53)
Alkaline Phosphatase: 107 U/L (ref 39–117)
CO2: 17 mEq/L — ABNORMAL LOW (ref 19–32)
Chloride: 105 mEq/L (ref 96–112)
Glucose, Bld: 90 mg/dL (ref 70–99)
Potassium: 3.5 mEq/L (ref 3.5–5.1)
Sodium: 138 mEq/L (ref 135–145)
Total Bilirubin: 2.1 mg/dL — ABNORMAL HIGH (ref 0.3–1.2)
Total Protein: 6.5 g/dL (ref 6.0–8.3)

## 2010-10-01 LAB — HEPATIC FUNCTION PANEL
ALT: 38 U/L (ref 0–53)
AST: 24 U/L (ref 0–37)
Albumin: 3.4 g/dL — ABNORMAL LOW (ref 3.5–5.2)
Alkaline Phosphatase: 122 U/L — ABNORMAL HIGH (ref 39–117)
Total Bilirubin: 2.1 mg/dL — ABNORMAL HIGH (ref 0.3–1.2)

## 2010-10-01 LAB — DIFFERENTIAL
Basophils Absolute: 0 10*3/uL (ref 0.0–0.1)
Basophils Relative: 0 % (ref 0–1)
Lymphocytes Relative: 15 % (ref 12–46)
Monocytes Relative: 7 % (ref 3–12)
Neutro Abs: 5.2 10*3/uL (ref 1.7–7.7)
Neutrophils Relative %: 78 % — ABNORMAL HIGH (ref 43–77)

## 2010-10-01 LAB — CBC
HCT: 40 % (ref 39.0–52.0)
Hemoglobin: 14.4 g/dL (ref 13.0–17.0)
RBC: 4.96 MIL/uL (ref 4.22–5.81)

## 2010-10-02 ENCOUNTER — Other Ambulatory Visit: Payer: Self-pay | Admitting: Internal Medicine

## 2010-10-02 ENCOUNTER — Inpatient Hospital Stay (HOSPITAL_COMMUNITY): Payer: BC Managed Care – PPO

## 2010-10-02 DIAGNOSIS — K2970 Gastritis, unspecified, without bleeding: Secondary | ICD-10-CM

## 2010-10-02 DIAGNOSIS — K2980 Duodenitis without bleeding: Secondary | ICD-10-CM

## 2010-10-02 DIAGNOSIS — R112 Nausea with vomiting, unspecified: Secondary | ICD-10-CM

## 2010-10-02 DIAGNOSIS — K2990 Gastroduodenitis, unspecified, without bleeding: Secondary | ICD-10-CM

## 2010-10-02 LAB — BASIC METABOLIC PANEL
BUN: 13 mg/dL (ref 6–23)
Calcium: 8.8 mg/dL (ref 8.4–10.5)
Chloride: 109 mEq/L (ref 96–112)
Creatinine, Ser: 1.34 mg/dL (ref 0.4–1.5)
GFR calc non Af Amer: 54 mL/min — ABNORMAL LOW (ref >60)
Sodium: 140 mEq/L (ref 135–145)

## 2010-10-02 LAB — CBC
HCT: 34.9 % — ABNORMAL LOW (ref 39.0–52.0)
Hemoglobin: 11.8 g/dL — ABNORMAL LOW (ref 13.0–17.0)
MCHC: 33.8 g/dL (ref 30.0–36.0)
WBC: 4.9 10*3/uL (ref 4.0–10.5)

## 2010-10-02 LAB — URINALYSIS, ROUTINE W REFLEX MICROSCOPIC
Glucose, UA: NEGATIVE mg/dL
Ketones, ur: >80 mg/dL — AB
Protein, ur: NEGATIVE mg/dL

## 2010-10-02 LAB — URINE MICROSCOPIC-ADD ON

## 2010-10-02 NOTE — H&P (Signed)
NAME:  Joshua Mcpherson, Joshua Mcpherson NO.:  [id number]  MEDICAL RECORD NO.:  [id number]           PATIENT TYPE:  I  LOCATION:  4703                         FACILITY:  [facility]  PHYSICIAN:  Zannie Cove, MD     DATE OF BIRTH:  [birth year]  DATE OF ADMISSION:  10/01/2010 DATE OF DISCHARGE:                             HISTORY & PHYSICAL   PRIMARY CARE PHYSICIAN:  Antony Madura, MD  CHIEF COMPLAINT:  Nausea, vomiting, and weight loss.  HISTORY OF PRESENT ILLNESS:  Mr. Sigl is a 65 year old gentleman with past medical history significant for recent CABG 6 weeks ago by Dr. Laneta Simmers.  He was discharged from the hospital 5 weeks ago after a good postop recovery.  He reports that he did have some nausea prior to discharge, but it had improved some and subsequently went home, but since he went home for the past almost 5 weeks now, he has had persistent nausea and vomiting, which has progressively gotten worse. His vomitus contains occasional greenish bile, no blood in his vomitus. No abdominal pain.  No diarrhea.  No blood in stools, no black stools. No nausea or vomiting.  No flank tenderness.  He did have a recent gout flare about 4-5 weeks ago for which he took a short course of prednisone and this subsequently resolved.  His p.o. intake has considerably decreased with a course of this last 4-5 weeks.  Subsequently, followed up with Dr. Swaziland as well as Dr. Laneta Simmers and they ended up stopping all of his medications because they attributed his persistent nausea and vomiting to his medications.  However, his symptoms have persistent despite this.  Today, he went to Dr. Su Hilt' office with 35 pound weight loss, persistent nausea, vomiting, and dehydration, hence was referred to the hospitalist for direct admit.  He is currently only taking baby aspirin and was started on Prevacid twice a day.  PAST MEDICAL HISTORY:  Significant for CAD status post CABG on August 21, 2010.  PAST SURGICAL HISTORY:  Other past medical history is significant for, 1. History of multiple fractures in his youth. 2. History of tonsillectomy.  SOCIAL HISTORY:  He lives with his wife.  He is a retired Runner, broadcasting/film/video.  He used to drink heavily about 6-8 cans of beer in a week, but did not drink any alcohol for the last 6 weeks since his surgery.  Denies any history of tobacco use.  He is physically very active and plays golf regularly.  FAMILY HISTORY:  History of permanent pacemaker in his mother, father in his 40s had heart disease and heart attack in his 47s.  ALLERGIES:  No known drug allergies.  REVIEW OF SYSTEMS:  Twelve system review is negative except for HPI.  PHYSICAL EXAMINATION:  VITAL SIGNS:  He is afebrile, pulse is in the 90s, blood pressure 126/72, respirations 20, ans satting at 98% on 2 L. GENERAL:  He is a middle-aged gentleman, laying in the stretcher in no acute distress. HEENT:  Pupils are round and reactive to light.  Extraocular movements intact.  Oral mucosa is dry.  No scleral icterus or pallor. CARDIOVASCULAR:  S1, S2 regular  rate and rhythm, mildly tachycardic. LUNGS:  Clear to auscultation bilaterally.  Mid sternotomy scar is well- healed. ABDOMEN:  Soft and nontender with decreased bowel sounds.  No organomegaly. EXTREMITIES:  No edema, clubbing, or cyanosis.  ASSESSMENT:  A 65 year old gentleman with, 1. Persistent nausea and vomiting. 2. Dehydration. 3. Weight loss. 4. Recent coronary artery bypass grafting 6 weeks ago.  PLAN:  The direct admit from primary care physician's office.  We will check stat labs; CBC, BMET, LFTs, lipase, UA, and KUB.  We will start him on IV PPI.  We will start him on aggressive rehydration.  Put him on clear liquid diet now to advance as tolerated.  Also, get a KUB.  I have consulted [facility] for further assistance with management.  I think EGD may be worthwhile given the duration of his symptoms.   Currently, abdominal exam is unremarkable.  However, we will can consider CT if his abdomen and pelvis symptoms persist or otherwise his workup is unrevealing.  Further management as condition evolves.     Zannie Cove, MD     PJ/MEDQ  D:  10/01/2010  T:  10/01/2010  Job:  [id number]  cc:   Antony Madura, M.D. Evelene Croon, M.D.  Electronically Signed by Zannie Cove  on 10/02/2010 05:49:29 PM

## 2010-10-03 ENCOUNTER — Inpatient Hospital Stay (HOSPITAL_COMMUNITY): Payer: BC Managed Care – PPO

## 2010-10-03 ENCOUNTER — Encounter (HOSPITAL_COMMUNITY): Payer: Self-pay | Admitting: Radiology

## 2010-10-03 DIAGNOSIS — K2900 Acute gastritis without bleeding: Secondary | ICD-10-CM

## 2010-10-03 LAB — LIPASE, BLOOD: Lipase: 45 U/L (ref 11–59)

## 2010-10-03 LAB — PREALBUMIN: Prealbumin: 15.1 mg/dL — ABNORMAL LOW (ref 17.0–34.0)

## 2010-10-03 LAB — GLUCOSE, CAPILLARY: Glucose-Capillary: 120 mg/dL — ABNORMAL HIGH (ref 70–99)

## 2010-10-03 MED ORDER — IOHEXOL 300 MG/ML  SOLN
100.0000 mL | Freq: Once | INTRAMUSCULAR | Status: AC | PRN
  Administered 2010-10-03: 100 mL via INTRAVENOUS

## 2010-10-04 ENCOUNTER — Inpatient Hospital Stay (HOSPITAL_COMMUNITY): Payer: BC Managed Care – PPO

## 2010-10-04 LAB — COMPREHENSIVE METABOLIC PANEL
AST: 21 U/L (ref 0–37)
Albumin: 2.4 g/dL — ABNORMAL LOW (ref 3.5–5.2)
BUN: 8 mg/dL (ref 6–23)
Calcium: 8.5 mg/dL (ref 8.4–10.5)
Creatinine, Ser: 1.03 mg/dL (ref 0.4–1.5)
GFR calc Af Amer: >60 mL/min (ref >60)
GFR calc non Af Amer: >60 mL/min (ref >60)
Total Bilirubin: 0.6 mg/dL (ref 0.3–1.2)

## 2010-10-04 LAB — DIFFERENTIAL
Basophils Absolute: 0 10*3/uL (ref 0.0–0.1)
Basophils Relative: 0 % (ref 0–1)
Eosinophils Absolute: 0.1 10*3/uL (ref 0.0–0.7)
Eosinophils Relative: 2 % (ref 0–5)
Monocytes Absolute: 0.4 10*3/uL (ref 0.1–1.0)
Monocytes Relative: 9 % (ref 3–12)

## 2010-10-04 LAB — CBC
HCT: 29.7 % — ABNORMAL LOW (ref 39.0–52.0)
Hemoglobin: 10.1 g/dL — ABNORMAL LOW (ref 13.0–17.0)
MCH: 28.1 pg (ref 26.0–34.0)
MCHC: 34 g/dL (ref 30.0–36.0)
MCV: 82.7 fL (ref 78.0–100.0)
Platelets: 165 K/uL (ref 150–400)
RBC: 3.59 MIL/uL — ABNORMAL LOW (ref 4.22–5.81)
RDW: 14.1 % (ref 11.5–15.5)
WBC: 4.2 K/uL (ref 4.0–10.5)

## 2010-10-04 LAB — TRIGLYCERIDES: Triglycerides: 123 mg/dL (ref <150)

## 2010-10-04 LAB — CHOLESTEROL, TOTAL

## 2010-10-04 LAB — PREALBUMIN: Prealbumin: 14 mg/dL — ABNORMAL LOW (ref 17.0–34.0)

## 2010-10-04 LAB — GLUCOSE, CAPILLARY
Glucose-Capillary: 128 mg/dL — ABNORMAL HIGH (ref 70–99)
Glucose-Capillary: 87 mg/dL (ref 70–99)
Glucose-Capillary: 137 mg/dL — ABNORMAL HIGH (ref 70–99)

## 2010-10-04 LAB — MAGNESIUM: Magnesium: 1.6 mg/dL (ref 1.5–2.5)

## 2010-10-04 LAB — PHOSPHORUS: Phosphorus: 2.7 mg/dL (ref 2.3–4.6)

## 2010-10-05 LAB — BASIC METABOLIC PANEL
CO2: 25 mEq/L (ref 19–32)
Calcium: 8.2 mg/dL — ABNORMAL LOW (ref 8.4–10.5)
Chloride: 109 mEq/L (ref 96–112)
GFR calc Af Amer: >60 mL/min (ref >60)
Glucose, Bld: 127 mg/dL — ABNORMAL HIGH (ref 70–99)
Sodium: 141 mEq/L (ref 135–145)

## 2010-10-05 LAB — GLUCOSE, CAPILLARY: Glucose-Capillary: 109 mg/dL — ABNORMAL HIGH (ref 70–99)

## 2010-10-05 LAB — PHOSPHORUS: Phosphorus: 4.1 mg/dL (ref 2.3–4.6)

## 2010-10-06 ENCOUNTER — Inpatient Hospital Stay (HOSPITAL_COMMUNITY): Payer: BC Managed Care – PPO

## 2010-10-06 LAB — COMPREHENSIVE METABOLIC PANEL
ALT: 67 U/L — ABNORMAL HIGH (ref 0–53)
AST: 42 U/L — ABNORMAL HIGH (ref 0–37)
Albumin: 2.2 g/dL — ABNORMAL LOW (ref 3.5–5.2)
Calcium: 8.1 mg/dL — ABNORMAL LOW (ref 8.4–10.5)
GFR calc Af Amer: >60 mL/min (ref >60)
Sodium: 137 mEq/L (ref 135–145)
Total Protein: 5 g/dL — ABNORMAL LOW (ref 6.0–8.3)

## 2010-10-06 LAB — GLUCOSE, CAPILLARY

## 2010-10-06 MED ORDER — TECHNETIUM TC 99M SULFUR COLLOID
2.0000 | Freq: Once | INTRAVENOUS | Status: AC | PRN
  Administered 2010-10-06: 2 via INTRAVENOUS

## 2010-10-07 LAB — GLUCOSE, CAPILLARY: Glucose-Capillary: 100 mg/dL — ABNORMAL HIGH (ref 70–99)

## 2010-10-16 NOTE — Discharge Summary (Signed)
NAME:  Joshua Mcpherson, Joshua Mcpherson NO.:  [id number]  MEDICAL RECORD NO.:  [id number]           PATIENT TYPE:  LOCATION:                                 FACILITY:  PHYSICIAN:  Zannie Cove, MD     DATE OF BIRTH:  07/18/1946  DATE OF ADMISSION: DATE OF DISCHARGE:                              DISCHARGE SUMMARY   PRIMARY CARE PHYSICIAN:  Antony Madura, MD.  DISCHARGE DIAGNOSES: 1. Intractable nausea, vomiting, improved. 2. Mild gastritis in the pyloric channel and small pyloric channel     ulcer. 3. Mild duodenitis. 4. Functional component to his nausea, vomiting, now improved. 5. Status post recent coronary artery bypass graft. 6. Profound dehydration. 7. Protein-calorie malnutrition.  DISCHARGE MEDICATIONS:  As follows: 1. Aspirin 81 mg daily. 2. Zofran 4 mg p.o. q.6 h. p.r.n. 3. Protonix 40 mg p.o. b.i.d. for 2 weeks and then once a day.  CONSULTANTS:  Dr. Lina Sar,  GI.  PROCEDURES AND DIAGNOSTICS: 1. EGD, March 15, showed duodenitis, mild gastritis, and pyloric     channel small ulcer, otherwise unremarkable exam. 2. CT abdomen and pelvis, March 16, showed no acute process in the     abdomen or pelvis, no evidence of bowel obstruction. 3. Gastric emptying study, March 19, normal gastric emptying study.  HOSPITAL COURSE:  Joshua Mcpherson is a very pleasant 65 year old Caucasian gentleman who is 6 weeks postop from his CABG, who presented to the hospital with persistent nausea, vomiting, dehydration, and 63 pound weight loss, which had been going on for 6 weeks ever since his CABG. On initial evaluation, he was profoundly dehydrated and he had lost 35 pounds of weight and started on TPN for this.  As part of his workup, he had a normal CT of abdomen and pelvis.  He had a normal gastric emptying study and an EGD which just showed mild duodenitis and a small pyloric channel ulcer.  He was started on IV Protonix initially and  subsequently transitioned to p.o.  We are suspicion in terms of the etiology is mild peptic ulcer disease and large component of functional nausea and vomiting.  The patient currently is off TPN from last night and currently tolerating most of his meals, eat 75% of his breakfast yesterday and about 50% of dinner, and was also seen by the nutritionist in the hospital.  He will continue on Protonix twice a day for 2 weeks and then once a day, and will follow up with his primary physician within 1 week.  Status post CABG, no acute issues, has remained stable, continued on just aspirin a day now since this is what he had been most recently prior to admission.  DISCHARGE CONDITION:  Stable.  Vital Signs:  Temperature is 98.1, pulse 72, blood pressure 118/73, respirations 20, satting 98% room air.  DISCHARGE LABS:  BMET:  Sodium 137, potassium 3.9, chloride 108, bicarb 25, BUN 11, creatinine 3.9, glucose 119.  DISCHARGE FOLLOWUP:  With Dr. Su Hilt or Associates within 1 week.  Of note, the patient had mildly elevated CBGs and hemoglobin A1c was ordered, but unfortunately was not done.  I suspect this mild  hyperglycemia was secondary to his TNA, however, please check hemoglobin A1c as outpatient and adjust accordingly.     Zannie Cove, MD     PJ/MEDQ  D:  10/07/2010  T:  10/08/2010  Job:  [id number]  cc:   Antony Madura, M.D. Peter M. Swaziland, M.D. Evelene Croon, M.D.  Electronically Signed by Zannie Cove  on 10/16/2010 05:56:04 PM

## 2010-10-17 ENCOUNTER — Encounter: Payer: Self-pay | Admitting: Cardiology

## 2010-10-17 ENCOUNTER — Ambulatory Visit (INDEPENDENT_AMBULATORY_CARE_PROVIDER_SITE_OTHER): Payer: BC Managed Care – PPO | Admitting: Cardiology

## 2010-10-17 DIAGNOSIS — I519 Heart disease, unspecified: Secondary | ICD-10-CM

## 2010-10-17 DIAGNOSIS — F329 Major depressive disorder, single episode, unspecified: Secondary | ICD-10-CM | POA: Insufficient documentation

## 2010-10-17 DIAGNOSIS — I2109 ST elevation (STEMI) myocardial infarction involving other coronary artery of anterior wall: Secondary | ICD-10-CM

## 2010-10-17 DIAGNOSIS — R531 Weakness: Secondary | ICD-10-CM | POA: Insufficient documentation

## 2010-10-17 DIAGNOSIS — M109 Gout, unspecified: Secondary | ICD-10-CM | POA: Insufficient documentation

## 2010-10-17 DIAGNOSIS — R42 Dizziness and giddiness: Secondary | ICD-10-CM | POA: Insufficient documentation

## 2010-10-17 DIAGNOSIS — E7800 Pure hypercholesterolemia, unspecified: Secondary | ICD-10-CM

## 2010-10-17 DIAGNOSIS — R110 Nausea: Secondary | ICD-10-CM | POA: Insufficient documentation

## 2010-10-17 DIAGNOSIS — R630 Anorexia: Secondary | ICD-10-CM

## 2010-10-17 DIAGNOSIS — F32A Depression, unspecified: Secondary | ICD-10-CM | POA: Insufficient documentation

## 2010-10-17 NOTE — Patient Instructions (Signed)
Continue medications as prescribed.  Work on increasing po intake and weight gain.   Increase aerobic activity but continue lifting restriction.  We will schedule a follow up office visit in 2 months with CBC, Chemistry panel and lipid panel fasting.

## 2010-10-19 ENCOUNTER — Encounter: Payer: Self-pay | Admitting: Cardiology

## 2010-10-19 DIAGNOSIS — E7800 Pure hypercholesterolemia, unspecified: Secondary | ICD-10-CM | POA: Insufficient documentation

## 2010-10-19 NOTE — Progress Notes (Signed)
HPI Joshua Mcpherson is seen today for followup after recent hospitalization with dehydration, protracted nausea vomiting, and weight loss. He was hydrated. He had extensive GI evaluation which was fairly unremarkable. He was placed on Reglan but is no longer taking this. He feels that he is doing much better. His nausea and vomiting have resolved. His appetite is still poor but he is eating more. He denies any significant chest pain, nausea, or vomiting. He has had no significant gouty symptoms. No Known Allergies  Current Outpatient Prescriptions on File Prior to Visit  Medication Sig Dispense Refill  . aspirin 81 MG tablet Take 81 mg by mouth daily.        . pantoprazole (PROTONIX) 40 MG tablet Take 40 mg by mouth 2 (two) times daily. TWICE A DAY FOR TWO WEEKS. STARTING October 23, 2010 HE WILL BEGIN TAKING ONE A DAY         Past Medical History  Diagnosis Date  . 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 Poor appetite   . Nausea   . Weakness   . Depression   . Fracture     MULTIPLE  . Gout   . LV dysfunction     Past Surgical History  Procedure Date  . Coronary artery bypass graft   . Cardiac catheterization   . Tonsillectomy   . Multiple fractures     Family History  Problem Relation Age of Onset  . Heart disease Father     History   Social History  . Marital Status: Married    Spouse Name: N/A    Number of Children: 2  . Years of Education: N/A   Occupational History  . physical education teacher     retired   Social History Main Topics  . Smoking status: Former Smoker    Types: Cigarettes    Quit date: 07/20/1968  . Smokeless tobacco: Not on file  . Alcohol Use: 1.8 oz/week    3 Cans of beer per week     OCCASSIONALY  . Drug Use: No  . Sexually Active: Not on file   Other Topics Concern  . Not on file   Social History Narrative  . No narrative on file    ROS Since his bypass surgery patient does have marked nausea vomiting and weight  loss. Etiology is unclear. This is slowly improving. As a result his medications have been largely held. He still has some sternal soreness from his surgery. His activity is increasing. All other systems are reviewed and are negative. PHYSICAL EXAM BP 120/80  Pulse 80  Ht 5\' 10"  (1.778 m)  Wt 165 lb (74.844 kg)  BMI 23.68 kg/m2 The patient is alert and oriented x 3.  The mood and affect are normal.  The skin is warm and dry.  Color is normal.  The HEENT exam reveals that the sclera are nonicteric.  The mucous membranes are moist.  The carotids are 2+ without bruits.  There is no thyromegaly.  There is no JVD.  The lungs are clear.  The chest wall is non tender.  The heart exam reveals a regular rate with a normal S1 and S2.  There are no murmurs, gallops, or rubs.  The PMI is not displaced.   Abdominal exam reveals good bowel sounds.  There is no guarding or rebound.  There is no hepatosplenomegaly or tenderness.  There are no masses.  Exam of the legs reveal no clubbing, cyanosis, or edema.  The legs are without rashes.  The distal pulses are intact.  Cranial nerves II - XII are intact.  Motor and sensory functions are intact.  The gait is normal.  ASSESSMENT AND PLAN

## 2010-10-19 NOTE — Assessment & Plan Note (Signed)
Progressing well from his bypass surgery. No significant angina. Continue aspirin therapy.

## 2010-10-19 NOTE — Assessment & Plan Note (Signed)
Moderate left ventricular dysfunction by previous cardiac catheterization with ejection fraction of 35-40%. We'll reassess on his next visit in 2 months status post revascularization. No evidence of congestive heart failure or volume overload.

## 2010-10-19 NOTE — Assessment & Plan Note (Signed)
Recent extensive GI evaluation was unremarkable. His appetite is slowly improving. Until his appetite returns we will avoid additional medication including ACE inhibitors, beta blockers, statin therapy. We will reassess on his followup in 2 months. Since he is not having significant gouty symptoms at this time we'll also hold his allopurinol.

## 2010-11-18 ENCOUNTER — Other Ambulatory Visit: Payer: Self-pay | Admitting: *Deleted

## 2010-11-18 DIAGNOSIS — Z79899 Other long term (current) drug therapy: Secondary | ICD-10-CM

## 2010-11-18 DIAGNOSIS — E7800 Pure hypercholesterolemia, unspecified: Secondary | ICD-10-CM

## 2010-12-16 ENCOUNTER — Other Ambulatory Visit (INDEPENDENT_AMBULATORY_CARE_PROVIDER_SITE_OTHER): Payer: BC Managed Care – PPO | Admitting: *Deleted

## 2010-12-16 ENCOUNTER — Other Ambulatory Visit (INDEPENDENT_AMBULATORY_CARE_PROVIDER_SITE_OTHER): Payer: BC Managed Care – PPO | Admitting: Cardiology

## 2010-12-16 DIAGNOSIS — Z13220 Encounter for screening for lipoid disorders: Secondary | ICD-10-CM

## 2010-12-16 DIAGNOSIS — E7800 Pure hypercholesterolemia, unspecified: Secondary | ICD-10-CM

## 2010-12-16 DIAGNOSIS — Z79899 Other long term (current) drug therapy: Secondary | ICD-10-CM

## 2010-12-16 LAB — LIPID PANEL
HDL: 42.3 mg/dL (ref >39.00)
VLDL: 34.8 mg/dL (ref 0.0–40.0)

## 2010-12-16 LAB — HEPATIC FUNCTION PANEL
Albumin: 3.4 g/dL — ABNORMAL LOW (ref 3.5–5.2)
Alkaline Phosphatase: 66 U/L (ref 39–117)

## 2010-12-16 LAB — BASIC METABOLIC PANEL
Calcium: 9 mg/dL (ref 8.4–10.5)
GFR: 68.36 mL/min (ref >60.00)
Glucose, Bld: 122 mg/dL — ABNORMAL HIGH (ref 70–99)
Sodium: 146 mEq/L — ABNORMAL HIGH (ref 135–145)

## 2010-12-16 LAB — CBC WITH DIFFERENTIAL/PLATELET
Basophils Absolute: 0 10*3/uL (ref 0.0–0.1)
Basophils Relative: 0.4 % (ref 0.0–3.0)
Eosinophils Absolute: 0.1 10*3/uL (ref 0.0–0.7)
Lymphocytes Relative: 21 % (ref 12.0–46.0)
MCHC: 34.2 g/dL (ref 30.0–36.0)
Neutrophils Relative %: 69.1 % (ref 43.0–77.0)
RBC: 4.31 Mil/uL (ref 4.22–5.81)
RDW: 19 % — ABNORMAL HIGH (ref 11.5–14.6)

## 2010-12-17 ENCOUNTER — Ambulatory Visit (INDEPENDENT_AMBULATORY_CARE_PROVIDER_SITE_OTHER): Payer: BC Managed Care – PPO | Admitting: Cardiology

## 2010-12-17 ENCOUNTER — Other Ambulatory Visit: Payer: BC Managed Care – PPO | Admitting: *Deleted

## 2010-12-17 ENCOUNTER — Encounter: Payer: Self-pay | Admitting: Cardiology

## 2010-12-17 DIAGNOSIS — I2109 ST elevation (STEMI) myocardial infarction involving other coronary artery of anterior wall: Secondary | ICD-10-CM

## 2010-12-17 DIAGNOSIS — I519 Heart disease, unspecified: Secondary | ICD-10-CM

## 2010-12-17 DIAGNOSIS — E7800 Pure hypercholesterolemia, unspecified: Secondary | ICD-10-CM

## 2010-12-17 DIAGNOSIS — I2510 Atherosclerotic heart disease of native coronary artery without angina pectoris: Secondary | ICD-10-CM

## 2010-12-17 MED ORDER — LISINOPRIL 5 MG PO TABS: 5.0000 mg | ORAL_TABLET | Freq: Every day | ORAL | Status: DC

## 2010-12-17 MED ORDER — CARVEDILOL 6.25 MG PO TABS
6.2500 mg | ORAL_TABLET | Freq: Two times a day (BID) | ORAL | Status: DC
Start: 2010-12-17 — End: 2011-12-03

## 2010-12-17 MED ORDER — ATORVASTATIN CALCIUM 40 MG PO TABS
40.0000 mg | ORAL_TABLET | Freq: Every day | ORAL | Status: DC
Start: 2010-12-17 — End: 2011-09-09

## 2010-12-17 NOTE — Patient Instructions (Signed)
We will resume lisinopril 5 mg daily.  Start Carvedilol 6.25 mg twice a day.  Start Lipitor 40 mg daily.  Continue your exercise program.   Avoid sodium.  We will see you again in 2 months and repeat lab work then.

## 2010-12-17 NOTE — Assessment & Plan Note (Signed)
His goal LDL is 70. We will start him on Lipitor 40 mg daily. We will repeat chemistries and lipid panel in 2 months.

## 2010-12-17 NOTE — Progress Notes (Signed)
   Joshua Mcpherson Date of Birth: 09-07-1945   History of Present Illness: [name] is seen for followup today. He is feeling much better. His appetite has returned. He is gaining weight. He was also on prednisone for a bout of gout. He does have a vague sensation in his left pectoral region but no true pain. He denies any dyspnea. He has been exercising 30 minutes 3 times a day. He denies any significant dyspnea or palpitations.  Current Outpatient Prescriptions on File Prior to Visit  Medication Sig Dispense Refill  . allopurinol (ZYLOPRIM) 300 MG tablet Take 300 mg by mouth daily.        Marland Kitchen aspirin 81 MG tablet Take 81 mg by mouth daily.        Marland Kitchen DISCONTD: pantoprazole (PROTONIX) 40 MG tablet Take 40 mg by mouth 2 (two) times daily. TWICE A DAY FOR TWO WEEKS. STARTING October 23, 2010 HE WILL BEGIN TAKING ONE A DAY         No Known Allergies  Past Medical History  Diagnosis Date  . 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 Depression   . Fracture     MULTIPLE  . Gout   . LV dysfunction     Past Surgical History  Procedure Date  . Coronary artery bypass graft   . Cardiac catheterization   . Tonsillectomy   . Multiple fractures     History  Smoking status  . Former Smoker -- 0.5 packs/day for 15 years  . Types: Cigarettes  . Quit date: 07/20/1968  Smokeless tobacco  . Never Used    History  Alcohol Use  . 1.8 oz/week  . 3 Cans of beer per week    OCCASSIONALY    Family History  Problem Relation Age of Onset  . Heart disease Father     Review of Systems: The review of systems is positive for intermittent gouty symptoms.  All other systems were reviewed and are negative.  Physical Exam: BP 138/76  Pulse 88  Ht 5' 10.5" (1.791 m)  Wt 174 lb 4 oz (79.039 kg)  BMI 24.65 kg/m2 He was a well-developed white male in no acute distress. His color is good. HEENT exam is unremarkable. He has no JVD or bruits. Lungs are clear. Cardiac exam  reveals a regular rate and rhythm without gallop, murmur, or click. His sternum has healed well. Abdomen is soft and nontender without masses or bruits. Femoral and pedal pulses are good. He has no edema. LABORATORY DATA: Chemistries today are normal with the exception of a glucose of 122. His total cholesterol is 207 with a triglyceride level of 74, HDL 42, and LDL of 154. Hemoglobin has improved to 12.9.  Assessment / Plan:

## 2010-12-17 NOTE — Assessment & Plan Note (Signed)
Joshua Mcpherson has recovered well from his bypass surgery. He is having no anginal symptoms. Since his nausea has resolved we will resume therapy with an ACE inhibitor and beta blocker. We will start a low dose with lisinopril 5 mg daily and carvedilol 6.25 mg twice a day. We will titrate these up as tolerated. He is cleared to resume normal activities including lifting and playing golf. I will follow up again in 2 months.

## 2010-12-17 NOTE — Assessment & Plan Note (Signed)
We will try to optimize his medical therapy and reassess his LV function in a few months.

## 2010-12-26 ENCOUNTER — Telehealth: Payer: Self-pay | Admitting: Cardiology

## 2010-12-26 NOTE — Telephone Encounter (Signed)
Called stating BP today was 109/64;104/60;98/58 hr 58-60; was dizzy this AM. States he saw Dr. Marisa Sprinkles and BP was 115/60. Taking Lisinopril 5 mg that was added at last visit. Tried to reassure him. Will speak w/ Dr. Swaziland on Monday and if wants to change dose will call him back.

## 2010-12-26 NOTE — Telephone Encounter (Signed)
Pt said he has been dizzy this morning He doesn't know if BP could be high please call

## 2010-12-29 NOTE — Telephone Encounter (Signed)
Per Dr. Swaziland in response to BP readings. Those are good. Continue to stay on same dose of Meds. Again cautioned about getting up too quickly. Body will adjust. Reassured.

## 2011-03-09 ENCOUNTER — Ambulatory Visit (INDEPENDENT_AMBULATORY_CARE_PROVIDER_SITE_OTHER): Payer: BC Managed Care – PPO | Admitting: *Deleted

## 2011-03-09 ENCOUNTER — Other Ambulatory Visit: Payer: BC Managed Care – PPO | Admitting: *Deleted

## 2011-03-09 DIAGNOSIS — E7800 Pure hypercholesterolemia, unspecified: Secondary | ICD-10-CM

## 2011-03-09 DIAGNOSIS — I2109 ST elevation (STEMI) myocardial infarction involving other coronary artery of anterior wall: Secondary | ICD-10-CM

## 2011-03-09 DIAGNOSIS — I519 Heart disease, unspecified: Secondary | ICD-10-CM

## 2011-03-09 LAB — HEPATIC FUNCTION PANEL
ALT: 12 U/L (ref 0–53)
AST: 17 U/L (ref 0–37)
Albumin: 3.4 g/dL — ABNORMAL LOW (ref 3.5–5.2)
Alkaline Phosphatase: 71 U/L (ref 39–117)
Total Protein: 6.3 g/dL (ref 6.0–8.3)

## 2011-03-09 LAB — LIPID PANEL
Cholesterol: 111 mg/dL (ref 0–200)
Triglycerides: 124 mg/dL (ref 0.0–149.0)

## 2011-03-09 LAB — BASIC METABOLIC PANEL
Calcium: 8.8 mg/dL (ref 8.4–10.5)
Creatinine, Ser: 1.2 mg/dL (ref 0.4–1.5)

## 2011-03-12 ENCOUNTER — Ambulatory Visit (INDEPENDENT_AMBULATORY_CARE_PROVIDER_SITE_OTHER): Payer: BC Managed Care – PPO | Admitting: Cardiology

## 2011-03-12 ENCOUNTER — Encounter: Payer: Self-pay | Admitting: Cardiology

## 2011-03-12 ENCOUNTER — Telehealth: Payer: Self-pay | Admitting: *Deleted

## 2011-03-12 VITALS — BP 102/64 | HR 64 | Ht 70.0 in | Wt 176.4 lb

## 2011-03-12 DIAGNOSIS — I519 Heart disease, unspecified: Secondary | ICD-10-CM

## 2011-03-12 DIAGNOSIS — I2109 ST elevation (STEMI) myocardial infarction involving other coronary artery of anterior wall: Secondary | ICD-10-CM

## 2011-03-12 DIAGNOSIS — I2510 Atherosclerotic heart disease of native coronary artery without angina pectoris: Secondary | ICD-10-CM

## 2011-03-12 NOTE — Telephone Encounter (Signed)
Lab results given to him at OV today. Gave him a copy. Sent copy to Dr. Su Hilt

## 2011-03-12 NOTE — Patient Instructions (Signed)
We will schedule you for an echocardiogram.  Continue your current therapy and exercise.  I will see you again in 6 months with fasting lab.

## 2011-03-12 NOTE — Assessment & Plan Note (Signed)
He has made an excellent recovery from his previous myocardial infarction and subsequent bypass surgery. He will continue with aspirin, beta blocker, ACE inhibitor, and Lipitor.

## 2011-03-12 NOTE — Telephone Encounter (Signed)
Message copied by Lorayne Bender on Thu Mar 12, 2011  9:23 AM ------      Message from: [place], PETER M      Created: Tue Mar 10, 2011  5:02 PM       Chemistries look good. Lipids are excellent.

## 2011-03-12 NOTE — Telephone Encounter (Signed)
Message copied by Lorayne Bender on Thu Mar 12, 2011 11:53 AM ------      Message from: [place], PETER M      Created: Tue Mar 10, 2011  5:02 PM       Chemistries look good. Lipids are excellent.

## 2011-03-12 NOTE — Progress Notes (Signed)
   Joshua Mcpherson Date of Birth: 04/20/1946   History of Present Illness: Joshua Mcpherson is seen for followup today. He is feeling much better. He is back doing normal activities. He is walking one hour per day and playing golf. One month ago he did develop a bad case of tendinitis in his left shoulder and was treated with a course of steroids. He also had a viral illness about that time and did receive a course of antibiotics. He denies any recurrent chest pain or shortness of breath. He said no palpitations. In general he feels very well but still has some anxiety concerning his prior heart attack.  Current Outpatient Prescriptions on File Prior to Visit  Medication Sig Dispense Refill  . allopurinol (ZYLOPRIM) 300 MG tablet Take 300 mg by mouth daily.        Marland Kitchen aspirin 81 MG tablet Take 81 mg by mouth daily.        Marland Kitchen atorvastatin (LIPITOR) 40 MG tablet Take 1 tablet (40 mg total) by mouth daily.  30 tablet  11  . carvedilol (COREG) 6.25 MG tablet Take 1 tablet (6.25 mg total) by mouth 2 (two) times daily.  60 tablet  11  . lisinopril (PRINIVIL,ZESTRIL) 5 MG tablet Take 1 tablet (5 mg total) by mouth daily.  30 tablet  11    No Known Allergies  Past Medical History  Diagnosis Date  . 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 Depression   . Fracture     MULTIPLE  . Gout   . LV dysfunction     Past Surgical History  Procedure Date  . Coronary artery bypass graft   . Cardiac catheterization   . Tonsillectomy   . Multiple fractures     History  Smoking status  . Former Smoker -- 0.5 packs/day for 15 years  . Types: Cigarettes  . Quit date: 07/20/1968  Smokeless tobacco  . Never Used    History  Alcohol Use  . 1.8 oz/week  . 3 Cans of beer per week    OCCASSIONALY    Family History  Problem Relation Age of Onset  . Heart disease Father     Review of Systems: The review of systems is positive for intermittent gouty symptoms.  All other systems  were reviewed and are negative.  Physical Exam: BP 102/64  Pulse 64  Ht 5\' 10"  (1.778 m)  Wt 176 lb 6.4 oz (80.015 kg)  BMI 25.31 kg/m2 He was a well-developed white male in no acute distress. His color is good. HEENT exam is unremarkable. He has no JVD or bruits. Lungs are clear. Cardiac exam reveals a regular rate and rhythm without gallop, murmur, or click. His sternum has healed well. Abdomen is soft and nontender without masses or bruits. Femoral and pedal pulses are good. He has no edema. LABORATORY DATA:   Assessment / Plan:

## 2011-03-12 NOTE — Assessment & Plan Note (Signed)
I recommended a followup echocardiogram to reassess his LV function since this has not been checked since his revascularization. His ejection fraction by cardiac catheterization at that time was 35-40%. He'll remain on his current dose of lisinopril and carvedilol. He has no evidence of volume overload.

## 2011-03-13 ENCOUNTER — Ambulatory Visit: Payer: BC Managed Care – PPO | Admitting: Cardiology

## 2011-03-16 ENCOUNTER — Encounter: Payer: Self-pay | Admitting: *Deleted

## 2011-03-24 ENCOUNTER — Ambulatory Visit (HOSPITAL_COMMUNITY): Payer: BC Managed Care – PPO | Attending: Cardiology

## 2011-03-24 DIAGNOSIS — I079 Rheumatic tricuspid valve disease, unspecified: Secondary | ICD-10-CM | POA: Insufficient documentation

## 2011-03-24 DIAGNOSIS — I2510 Atherosclerotic heart disease of native coronary artery without angina pectoris: Secondary | ICD-10-CM | POA: Insufficient documentation

## 2011-03-25 ENCOUNTER — Telehealth: Payer: Self-pay | Admitting: Cardiology

## 2011-03-25 NOTE — Telephone Encounter (Signed)
Pt's wife calling to obtain ECHO results for her husband that was done yesterday.  Please call pt back.

## 2011-03-26 ENCOUNTER — Telehealth: Payer: Self-pay | Admitting: *Deleted

## 2011-03-26 NOTE — Telephone Encounter (Signed)
Message copied by Lorayne Bender on Thu Mar 26, 2011  9:47 AM ------      Message from: [place], PETER M      Created: Tue Mar 24, 2011  5:29 PM       EF has improved significantly! Previously 35-40%. Excellent!

## 2011-03-26 NOTE — Telephone Encounter (Signed)
Notified of Echo results. Will send to Dr. Su Hilt

## 2011-07-30 ENCOUNTER — Telehealth: Payer: Self-pay | Admitting: Cardiology

## 2011-07-30 NOTE — Telephone Encounter (Signed)
New msg Pt's wife called she wants to know what he can take for sinus problems please call

## 2011-07-30 NOTE — Telephone Encounter (Signed)
Patient's wife was called,she stated patient had sinus problems.Advised okay to take sudafed if needed.

## 2011-09-09 ENCOUNTER — Ambulatory Visit (INDEPENDENT_AMBULATORY_CARE_PROVIDER_SITE_OTHER): Payer: BC Managed Care – PPO | Admitting: Cardiology

## 2011-09-09 ENCOUNTER — Encounter: Payer: Self-pay | Admitting: Cardiology

## 2011-09-09 VITALS — BP 112/69 | HR 52 | Ht 70.0 in | Wt 189.6 lb

## 2011-09-09 DIAGNOSIS — E7800 Pure hypercholesterolemia, unspecified: Secondary | ICD-10-CM

## 2011-09-09 DIAGNOSIS — I2510 Atherosclerotic heart disease of native coronary artery without angina pectoris: Secondary | ICD-10-CM

## 2011-09-09 DIAGNOSIS — E785 Hyperlipidemia, unspecified: Secondary | ICD-10-CM

## 2011-09-09 DIAGNOSIS — I2109 ST elevation (STEMI) myocardial infarction involving other coronary artery of anterior wall: Secondary | ICD-10-CM

## 2011-09-09 MED ORDER — ATORVASTATIN CALCIUM 20 MG PO TABS: 20.0000 mg | ORAL_TABLET | Freq: Every day | ORAL | Status: DC

## 2011-09-09 NOTE — Progress Notes (Signed)
   Joshua Mcpherson Date of Birth: 1946/03/13   History of Present Illness: Joshua Mcpherson is seen for followup today. He reports he is feeling well. He does walk 3 or 4 times a week for up to 3 miles. He plays golf. He denies any significant chest pain or shortness of breath with exertion. He notices only a slight shortness of breath when lying on his side. He does complain of more muscle weakness then he has had in the past. He is trying to do some muscle training. He has gained 13 pounds since last visit and his wife states it is because of her cooking.  Current Outpatient Prescriptions on File Prior to Visit  Medication Sig Dispense Refill  . allopurinol (ZYLOPRIM) 300 MG tablet Take 300 mg by mouth daily.        Marland Kitchen aspirin 81 MG tablet Take 81 mg by mouth daily.        . carvedilol (COREG) 6.25 MG tablet Take 1 tablet (6.25 mg total) by mouth 2 (two) times daily.  60 tablet  11  . lisinopril (PRINIVIL,ZESTRIL) 5 MG tablet Take 1 tablet (5 mg total) by mouth daily.  30 tablet  11    No Known Allergies  Past Medical History  Diagnosis Date  . 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 Depression   . Fracture     MULTIPLE  . Gout   . LV dysfunction     Past Surgical History  Procedure Date  . Coronary artery bypass graft   . Cardiac catheterization   . Tonsillectomy   . Multiple fractures     History  Smoking status  . Former Smoker -- 0.5 packs/day for 15 years  . Types: Cigarettes  . Quit date: 07/20/1968  Smokeless tobacco  . Never Used    History  Alcohol Use  . 1.8 oz/week  . 3 Cans of beer per week    OCCASSIONALY    Family History  Problem Relation Age of Onset  . Heart disease Father     Review of Systems: As noted in history of present illness.  All other systems were reviewed and are negative.  Physical Exam: BP 112/69  Pulse 52  Ht 5\' 10"  (1.778 m)  Wt 86.002 kg (189 lb 9.6 oz)  BMI 27.20 kg/m2 He was a well-developed white  male in no acute distress. His color is good. HEENT exam is unremarkable. He has no JVD or bruits. Lungs are clear. Cardiac exam reveals a regular rate and rhythm without gallop, murmur, or click. His sternum has healed well. Abdomen is soft and nontender without masses or bruits. Femoral and pedal pulses are good. He has no edema. LABORATORY DATA: ECG today demonstrates normal sinus rhythm with a rate of 55 beats per minute. He has poor with progression across the anterior precordium consistent with prior anterior myocardial infarction.  Assessment / Plan:

## 2011-09-09 NOTE — Assessment & Plan Note (Signed)
I reviewed his last lipid parameters which were excellent. He does appear to have some muscle weakness without significant myalgias. We will reduce his Lipitor to 20 mg per day and repeat fasting bloodwork in 6 months.

## 2011-09-09 NOTE — Patient Instructions (Signed)
Reduce your atorvastatin to 20 mg daily.  Continue your other medication.  I will see you in 6 months with fasting lab.  Try and lose weight.

## 2011-09-09 NOTE — Assessment & Plan Note (Signed)
He is status post CABG in February 2012 following an anterior myocardial infarction. He continues to do very well and is asymptomatic. We will continue with risk factor modification. I am concerned about his weight gain and I recommended he pay more attention to his diet to try and lose weight.

## 2011-12-03 ENCOUNTER — Other Ambulatory Visit: Payer: Self-pay | Admitting: Cardiology

## 2011-12-03 DIAGNOSIS — I519 Heart disease, unspecified: Secondary | ICD-10-CM

## 2011-12-03 MED ORDER — CARVEDILOL 6.25 MG PO TABS: 6.2500 mg | ORAL_TABLET | Freq: Two times a day (BID) | ORAL | Status: DC

## 2011-12-03 MED ORDER — LISINOPRIL 5 MG PO TABS: 5.0000 mg | ORAL_TABLET | Freq: Every day | ORAL | Status: DC

## 2011-12-11 ENCOUNTER — Other Ambulatory Visit (HOSPITAL_COMMUNITY): Payer: Self-pay | Admitting: *Deleted

## 2011-12-11 DIAGNOSIS — I519 Heart disease, unspecified: Secondary | ICD-10-CM

## 2011-12-11 MED ORDER — CARVEDILOL 6.25 MG PO TABS: 6.2500 mg | ORAL_TABLET | Freq: Two times a day (BID) | ORAL | Status: DC

## 2011-12-11 MED ORDER — LISINOPRIL 5 MG PO TABS: 5.0000 mg | ORAL_TABLET | Freq: Every day | ORAL | Status: DC

## 2011-12-11 NOTE — Telephone Encounter (Signed)
Refilled lisinopril and carvedilol.

## 2012-01-04 ENCOUNTER — Other Ambulatory Visit: Payer: Self-pay | Admitting: Cardiovascular Disease

## 2012-01-04 DIAGNOSIS — I519 Heart disease, unspecified: Secondary | ICD-10-CM

## 2012-01-04 MED ORDER — LISINOPRIL 5 MG PO TABS: 5.0000 mg | ORAL_TABLET | Freq: Every day | ORAL | Status: DC

## 2012-01-04 MED ORDER — CARVEDILOL 6.25 MG PO TABS: 6.2500 mg | ORAL_TABLET | Freq: Two times a day (BID) | ORAL | Status: DC

## 2012-01-04 NOTE — Telephone Encounter (Signed)
Fax Received. Refill Completed. Joshua Mcpherson (R.M.A)   

## 2012-03-09 IMAGING — CR DG CHEST 1V PORT
1 series · 1 of 1 positions shown · non-contrast
Comparison: 08/19/2010

CLINICAL DATA: CABG

PORTABLE CHEST - 1 VIEW

[view not recorded]
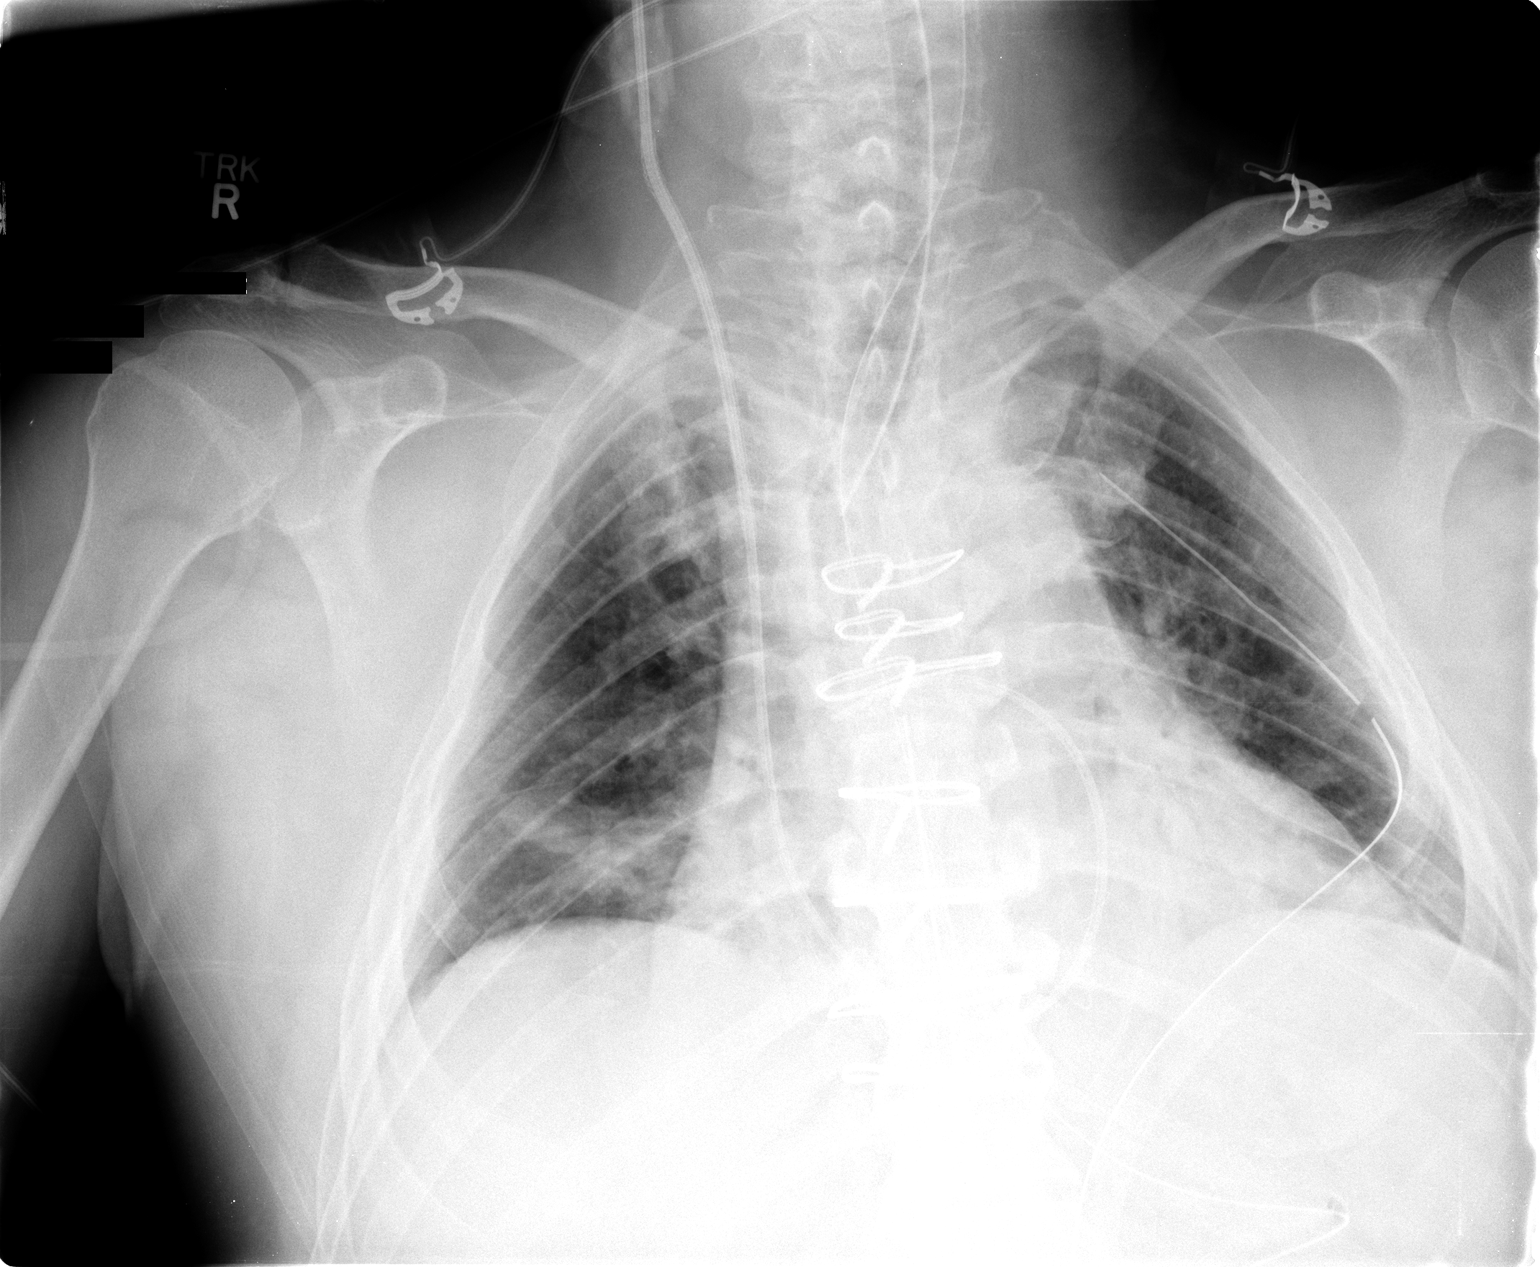

[1 of 1 positions shown; findings below may reference images not displayed]

FINDINGS: The heart remains moderately enlarged.  Endotracheal tube
tip is 3.8 cm from the carina.  NG tube is beyond the
gastroesophageal junction.  Chest and mediastinal tubes in place
without pneumothorax.  Pulmonary vascularity is within normal
limits.  Low volumes and bibasilar atelectasis.  Right internal
jugular introducer and Swan-Ganz catheter tip in the central right
pulmonary artery.
IMPRESSION: Postop chest.

Bibasilar atelectasis.

No pneumothorax or pulmonary edema.

## 2012-04-04 ENCOUNTER — Other Ambulatory Visit: Payer: BC Managed Care – PPO

## 2012-04-12 ENCOUNTER — Ambulatory Visit: Payer: BC Managed Care – PPO | Admitting: Cardiology

## 2012-04-20 IMAGING — US US ABDOMEN COMPLETE
1 series · 14 of 25 positions shown · non-contrast
Comparison: None.

CLINICAL DATA: Nausea and vomiting.

COMPLETE ABDOMINAL ULTRASOUND

[Series 1: us abdomen complete · 0.25mm/px · 14 of 69 slices shown]
[im 1/69]
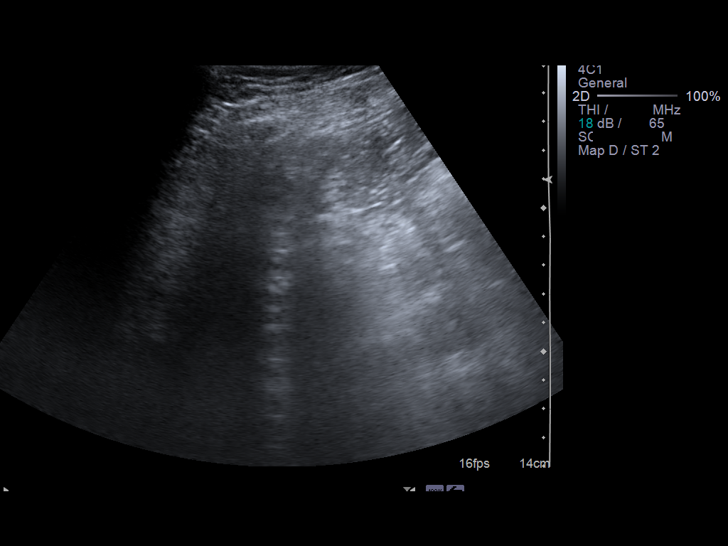
[im 6/69]
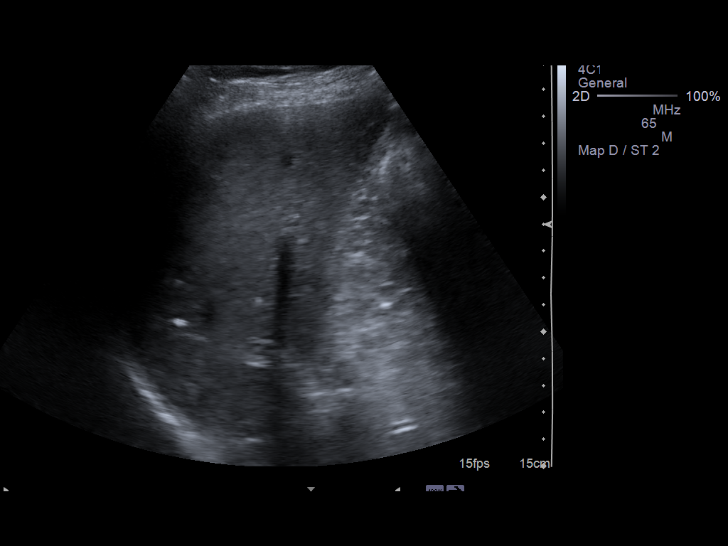
[im 12/69]
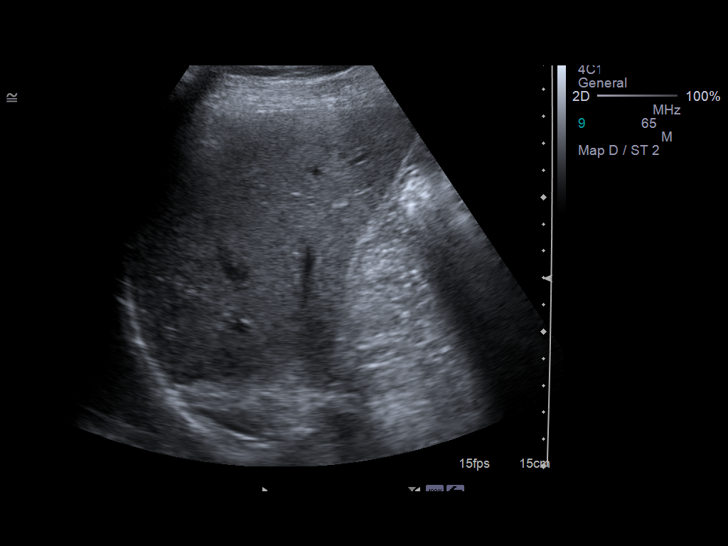
[im 18/69]
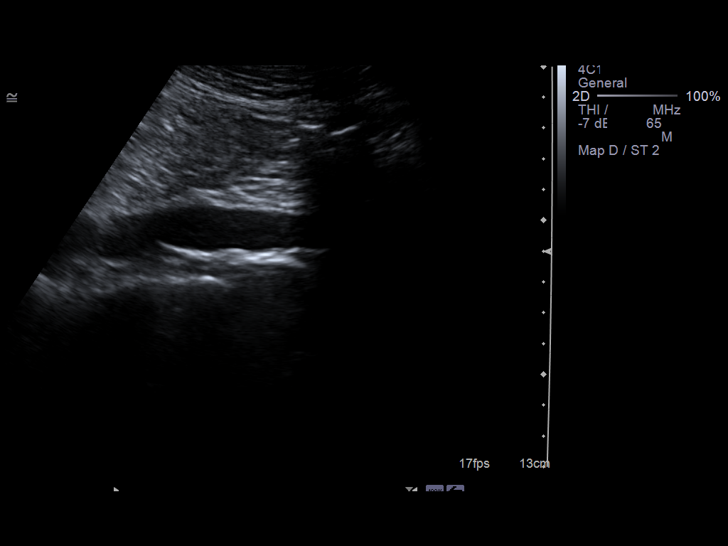
[im 23/69]
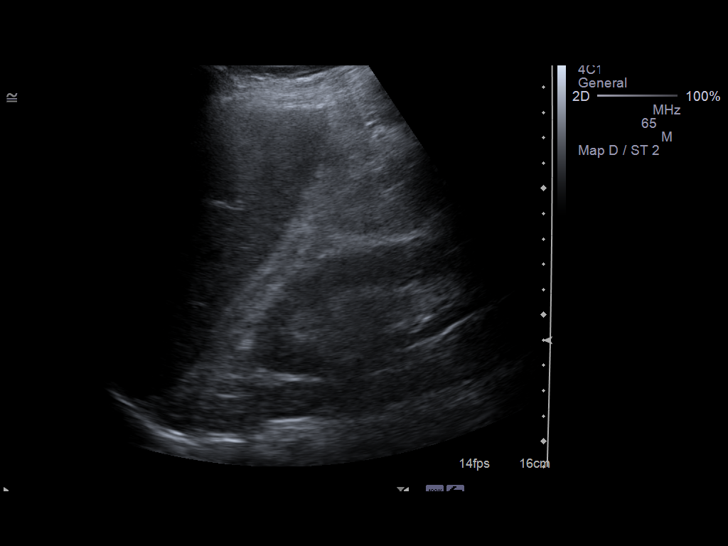
[im 26/69]
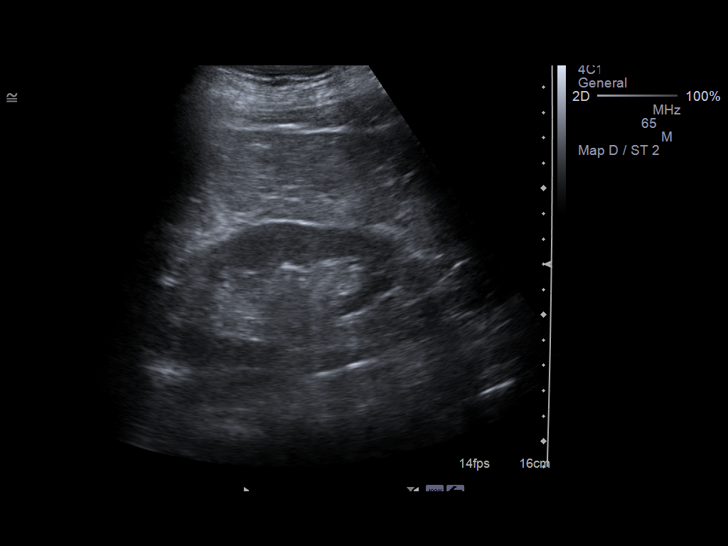
[im 32/69]
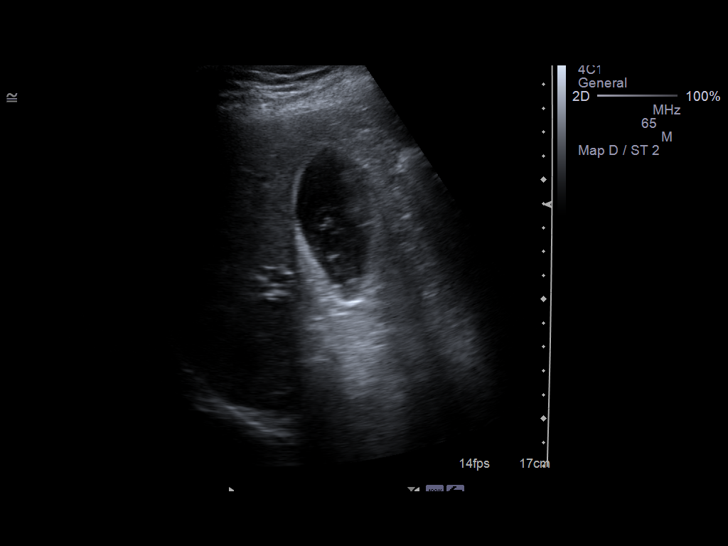
[im 37/69]
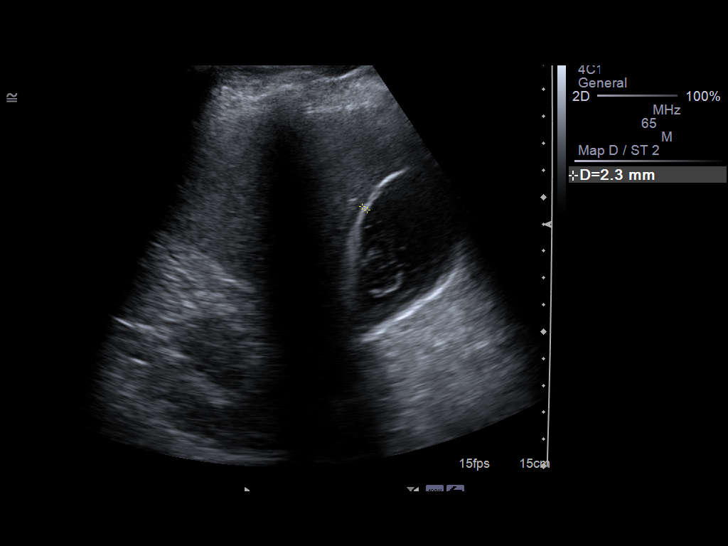
[im 43/69]
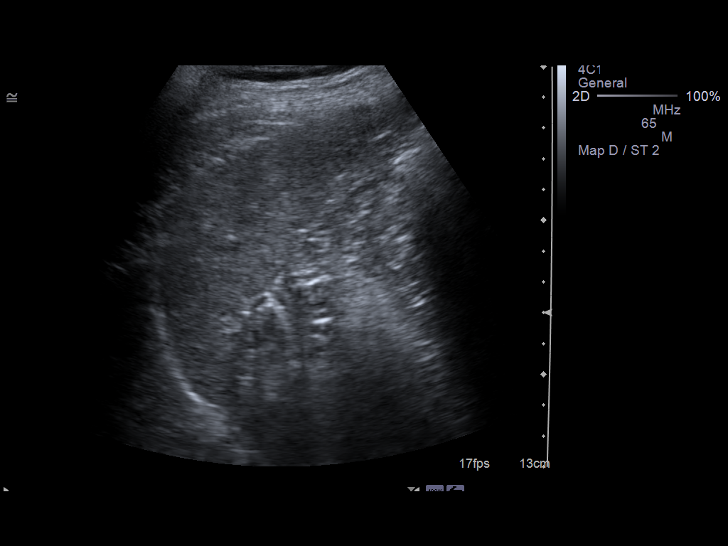
[im 46/69]
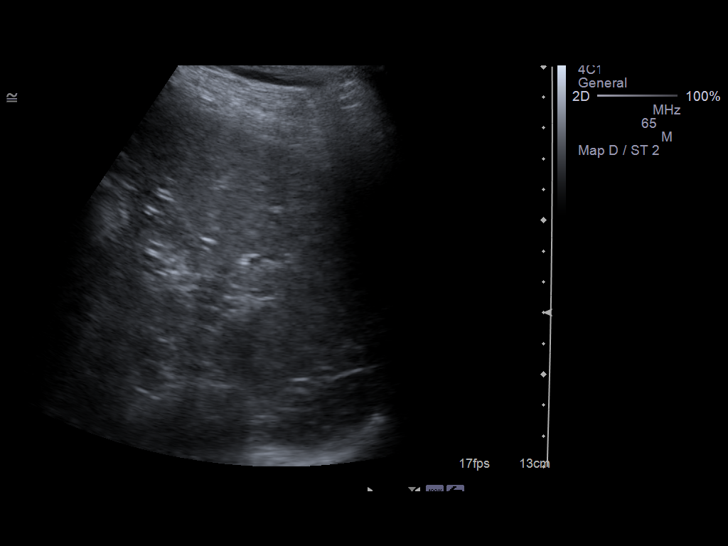
[im 52/69]
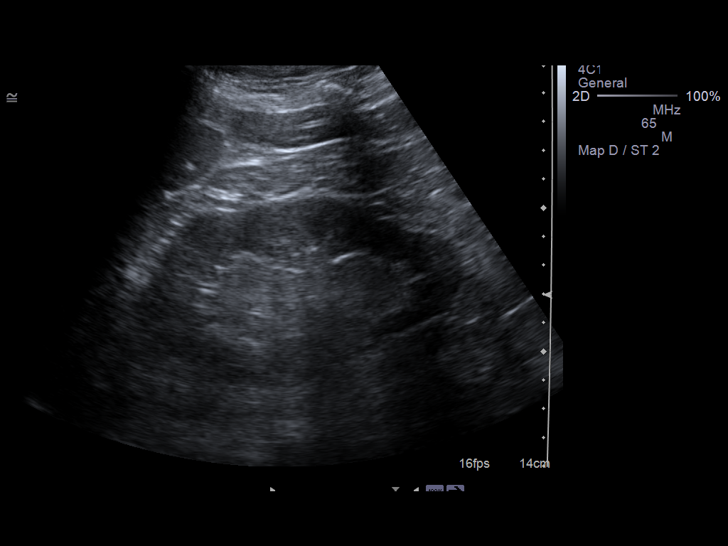
[im 57/69]
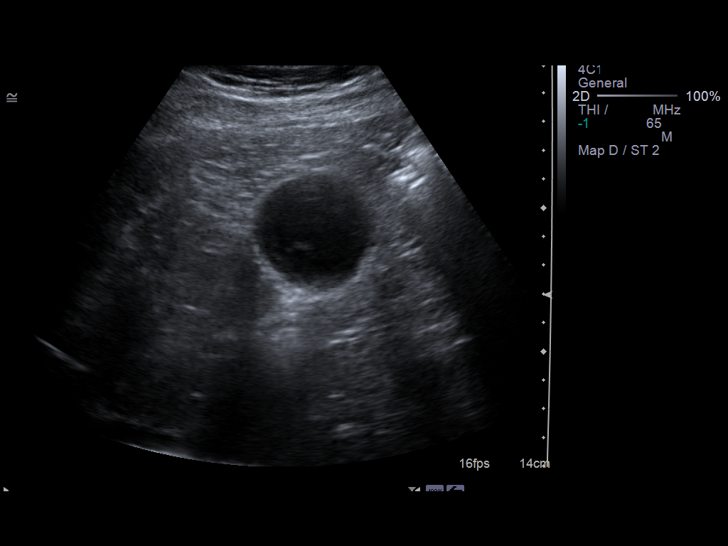
[im 63/69]
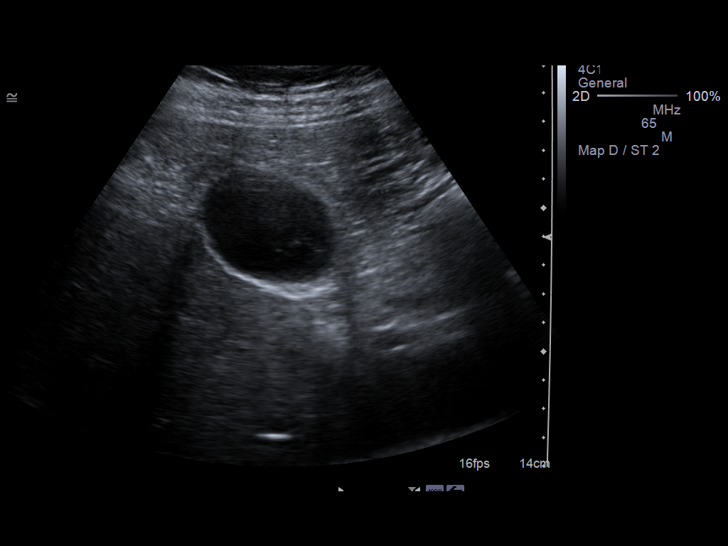
[im 69/69]
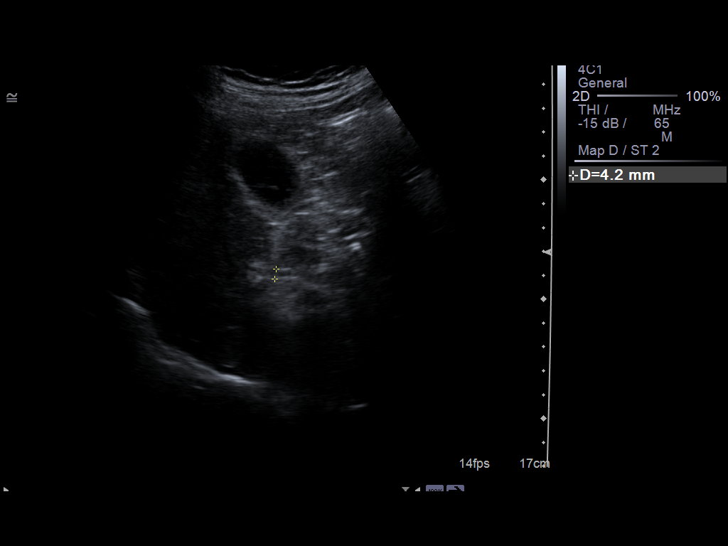

[14 of 25 positions shown; findings below may reference images not displayed]

FINDINGS: Gallbladder:  Nonshadowing echogenic debris is seen in the
gallbladder.  Gallbladder wall measures 2 mm.  No sonographic sign.

Common bile duct:  Measures 4 mm, within normal limits.

Liver:  Appears mildly echogenic throughout.

IVC:  Visualized.

Pancreas:  Pancreatic tail is poorly seen due to bowel gas.
Otherwise negative.

Spleen:  Measures 8.6 cm, negative.

Right Kidney:  Measures 11.4 cm, negative.

Left Kidney:  Measures 11.5 cm, negative.

Abdominal aorta:  No aneurysm identified.
IMPRESSION: 1.  Gallbladder sludge.
2.  Fatty liver.

## 2012-04-23 IMAGING — CR DG CHEST 1V PORT
1 series · 1 of 1 positions shown · non-contrast
Comparison: 10/02/2010 peri

CLINICAL DATA: PICC line placement.

PORTABLE CHEST - 1 VIEW

[AP]
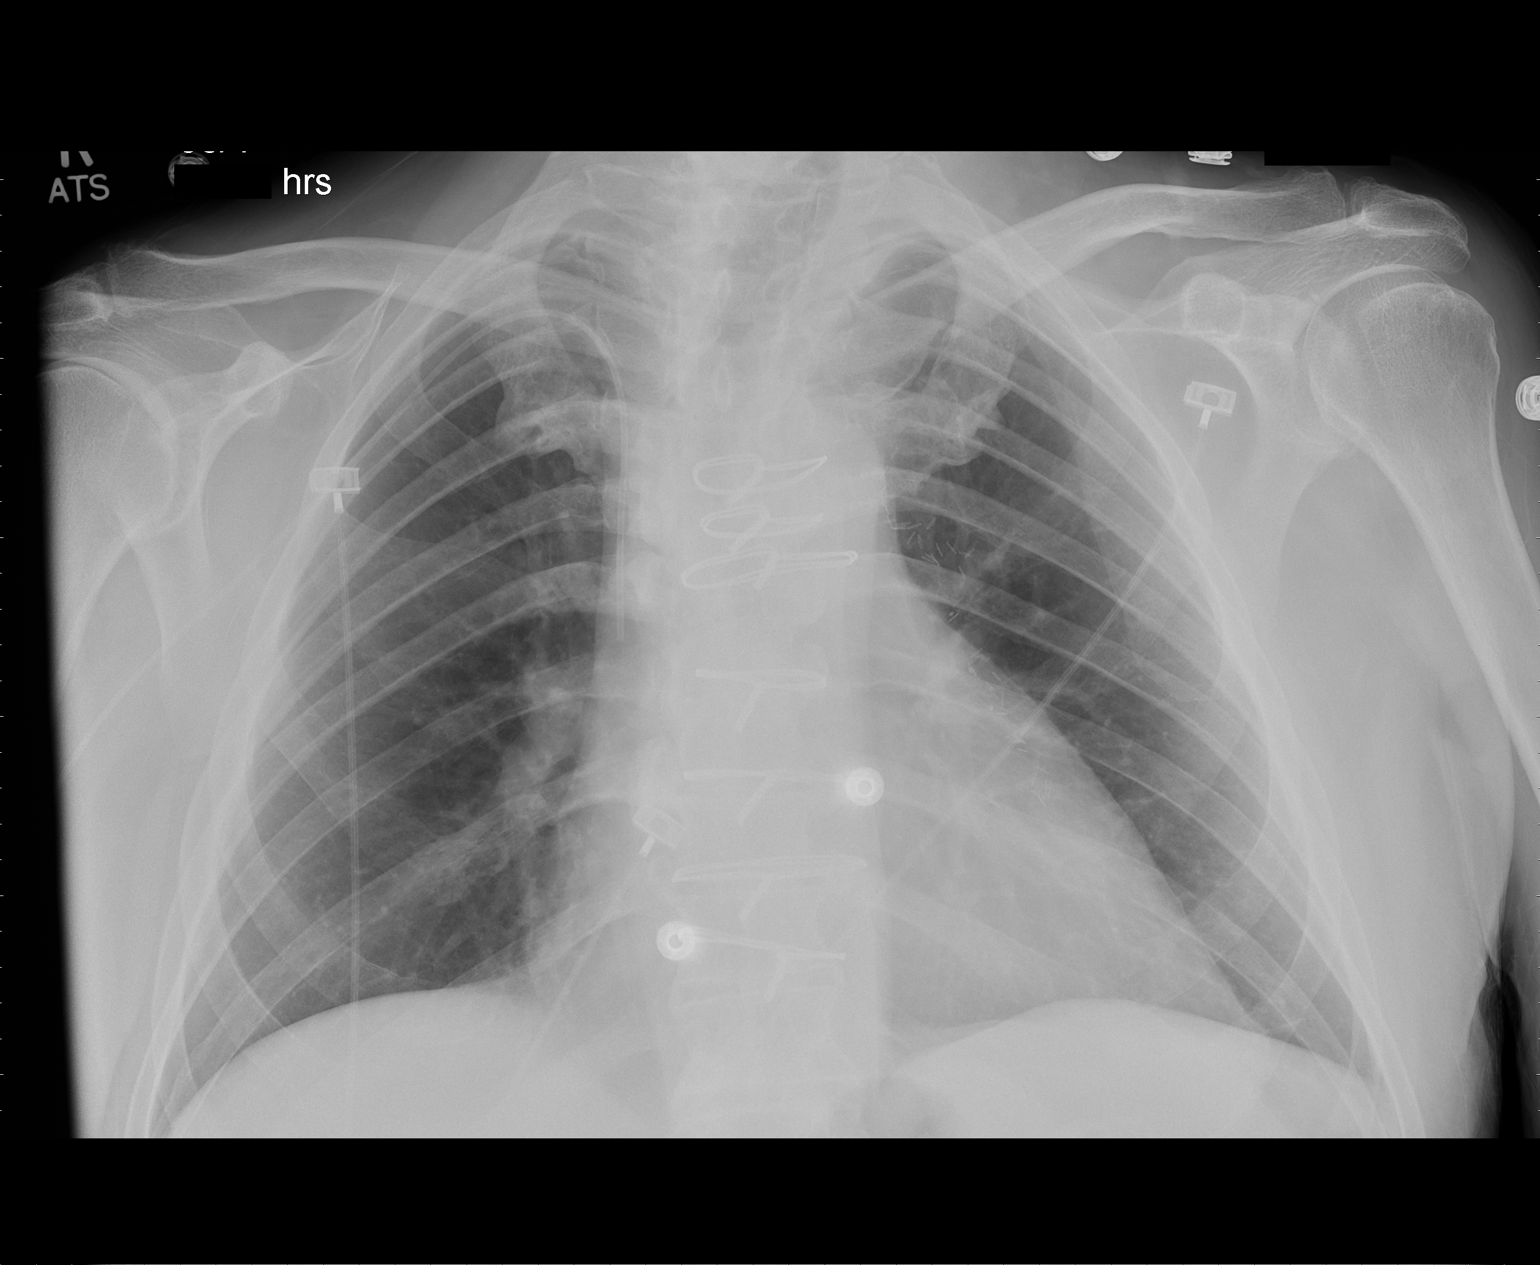

[1 of 1 positions shown; findings below may reference images not displayed]

FINDINGS: Heart size is normal.  The right-sided PICC line has been
retracted and now terminates in the mid SVC, above the cavoatrial
junction.  The lung volumes are low.  No focal airspace disease is
evident.
IMPRESSION: 1.  The PICC line now terminates in the mid SVC, above the
cavoatrial junction.
2.  No acute cardiopulmonary disease.

## 2012-05-05 ENCOUNTER — Encounter: Payer: Self-pay | Admitting: Cardiology

## 2012-05-05 ENCOUNTER — Ambulatory Visit (INDEPENDENT_AMBULATORY_CARE_PROVIDER_SITE_OTHER): Payer: BC Managed Care – PPO | Admitting: Cardiology

## 2012-05-05 VITALS — BP 122/70 | HR 56 | Ht 70.5 in | Wt 194.4 lb

## 2012-05-05 DIAGNOSIS — I25810 Atherosclerosis of coronary artery bypass graft(s) without angina pectoris: Secondary | ICD-10-CM | POA: Insufficient documentation

## 2012-05-05 DIAGNOSIS — E7800 Pure hypercholesterolemia, unspecified: Secondary | ICD-10-CM

## 2012-05-05 DIAGNOSIS — I2109 ST elevation (STEMI) myocardial infarction involving other coronary artery of anterior wall: Secondary | ICD-10-CM

## 2012-05-05 NOTE — Progress Notes (Signed)
   Joshua Mcpherson Date of Birth: Jun 28, 1946   History of Present Illness: Mr. Spadafore is seen for followup today. He reports he is feeling well. He has been walking up to 4 miles 4 days a week. He denies any chest pain or shortness of breath. He denies any palpitations or edema. He is trying to follow a heart healthy diet. He still gets some mild orthostatic dizziness at times.  Current Outpatient Prescriptions on File Prior to Visit  Medication Sig Dispense Refill  . allopurinol (ZYLOPRIM) 300 MG tablet Take 300 mg by mouth daily.        Marland Kitchen aspirin 81 MG tablet Take 81 mg by mouth daily.        Marland Kitchen atorvastatin (LIPITOR) 20 MG tablet Take 1 tablet (20 mg total) by mouth daily.  90 tablet  3  . carvedilol (COREG) 6.25 MG tablet Take 1 tablet (6.25 mg total) by mouth 2 (two) times daily.  180 tablet  3  . lisinopril (PRINIVIL,ZESTRIL) 5 MG tablet Take 1 tablet (5 mg total) by mouth daily.  90 tablet  3    No Known Allergies  Past Medical History  Diagnosis Date  . 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 Depression   . Fracture     MULTIPLE  . Gout   . LV dysfunction     Past Surgical History  Procedure Date  . Coronary artery bypass graft   . Cardiac catheterization   . Tonsillectomy   . Multiple fractures     History  Smoking status  . Former Smoker -- 0.5 packs/day for 15 years  . Types: Cigarettes  . Quit date: 07/20/1968  Smokeless tobacco  . Never Used    History  Alcohol Use  . 1.8 oz/week  . 3 Cans of beer per week    OCCASSIONALY    Family History  Problem Relation Age of Onset  . Heart disease Father     Review of Systems: As noted in history of present illness.  All other systems were reviewed and are negative.  Physical Exam: BP 122/70  Pulse 56  Ht 5' 10.5" (1.791 m)  Wt 194 lb 6.4 oz (88.179 kg)  BMI 27.50 kg/m2  SpO2 96% He was a well-developed white male in no acute distress. His color is good. HEENT exam is  unremarkable. He has no JVD or bruits. Lungs are clear. Cardiac exam reveals a regular rate and rhythm without gallop, murmur, or click. His sternum has healed well. Abdomen is soft and nontender without masses or bruits. Femoral and pedal pulses are good. He has no edema. LABORATORY DATA: Dated 04/26/2012 total cholesterol was 117, triglycerides 175, HDL 31, LDL 51. Thyroid function studies were normal. A1c was 5.6%. Complete chemistry panel was normal. CBC was normal.  Assessment / Plan: 1. Coronary disease status post anterior ST elevation myocardial infarction in January 2012. He underwent coronary bypass surgery. He has made an excellent recovery and is asymptomatic. We will continue with his risk factor modification.  2. Hyperlipidemia. LDL is at goal. He still has elevated triglycerides and low HDL. I recommended the addition of fish oil daily. He states he has a very hard time taking large pills but is going to try it. He will continue with atorvastatin.  3. History of LV dysfunction post MI. Subsequent echocardiogram following revascularization showed normalization with ejection fraction of 60%.

## 2012-05-05 NOTE — Patient Instructions (Signed)
Continue your current medication and exercise program  Consider taking 1 gram of fish oil daily.  I will see you in 6 months.

## 2012-05-09 ENCOUNTER — Ambulatory Visit: Payer: BC Managed Care – PPO | Admitting: Cardiology

## 2012-09-12 ENCOUNTER — Telehealth: Payer: Self-pay | Admitting: Cardiology

## 2012-09-12 DIAGNOSIS — E7800 Pure hypercholesterolemia, unspecified: Secondary | ICD-10-CM

## 2012-09-12 NOTE — Telephone Encounter (Signed)
REVIEWED PT'S CHART LAST LABS WERE DONE IN OCT 2013  WILL FORWARD TO DR Sw[place]iland FOR REVIEW PT HAS FOLLOW UP   IN MAY .Joshua Mcpherson

## 2012-09-12 NOTE — Telephone Encounter (Signed)
Let's do fasting lab and CBC next OV.  Peter Sw[place]iland MD, [facility]

## 2012-09-12 NOTE — Telephone Encounter (Signed)
PT'S wife calling to see if pt needs lab work at next visit

## 2012-09-14 NOTE — Telephone Encounter (Signed)
Spoke with patient and he will have fasting lab work before appointment with Dr.Jordan.Lab work scheduled 11/10/12.

## 2012-09-14 NOTE — Telephone Encounter (Signed)
See previous 09/14/12 note

## 2012-09-14 NOTE — Addendum Note (Signed)
Addended by: Meda Klinefelter D on: 09/14/2012 07:12 PM   Modules accepted: Orders

## 2012-10-10 ENCOUNTER — Other Ambulatory Visit: Payer: Self-pay | Admitting: Cardiology

## 2012-11-10 ENCOUNTER — Other Ambulatory Visit (INDEPENDENT_AMBULATORY_CARE_PROVIDER_SITE_OTHER): Payer: BC Managed Care – PPO

## 2012-11-10 DIAGNOSIS — I25810 Atherosclerosis of coronary artery bypass graft(s) without angina pectoris: Secondary | ICD-10-CM

## 2012-11-10 DIAGNOSIS — E7800 Pure hypercholesterolemia, unspecified: Secondary | ICD-10-CM

## 2012-11-10 LAB — CBC WITH DIFFERENTIAL/PLATELET
Basophils Relative: 0.3 % (ref 0.0–3.0)
Eosinophils Relative: 3.3 % (ref 0.0–5.0)
HCT: 39.1 % (ref 39.0–52.0)
Lymphs Abs: 1.4 10*3/uL (ref 0.7–4.0)
MCV: 89.1 fl (ref 78.0–100.0)
Monocytes Absolute: 0.5 10*3/uL (ref 0.1–1.0)
RBC: 4.39 Mil/uL (ref 4.22–5.81)
WBC: 5.7 10*3/uL (ref 4.5–10.5)

## 2012-11-10 LAB — BASIC METABOLIC PANEL
CO2: 26 mEq/L (ref 19–32)
Chloride: 102 mEq/L (ref 96–112)
Creatinine, Ser: 1.1 mg/dL (ref 0.4–1.5)

## 2012-11-10 LAB — HEPATIC FUNCTION PANEL
ALT: 18 U/L (ref 0–53)
Alkaline Phosphatase: 80 U/L (ref 39–117)
Bilirubin, Direct: 0.1 mg/dL (ref 0.0–0.3)
Total Protein: 6.7 g/dL (ref 6.0–8.3)

## 2012-11-17 ENCOUNTER — Encounter: Payer: Self-pay | Admitting: Cardiology

## 2012-11-17 ENCOUNTER — Ambulatory Visit (INDEPENDENT_AMBULATORY_CARE_PROVIDER_SITE_OTHER): Payer: BC Managed Care – PPO | Admitting: Cardiology

## 2012-11-17 VITALS — BP 110/64 | HR 54 | Ht 70.5 in | Wt 197.8 lb

## 2012-11-17 DIAGNOSIS — I2109 ST elevation (STEMI) myocardial infarction involving other coronary artery of anterior wall: Secondary | ICD-10-CM

## 2012-11-17 DIAGNOSIS — E7800 Pure hypercholesterolemia, unspecified: Secondary | ICD-10-CM

## 2012-11-17 NOTE — Progress Notes (Signed)
And in   [name] Date of Birth: 18-Apr-1946   History of Present Illness: Mr. Valverde is seen for followup today. He reports he is feeling well. He admits that he hasn't done much exercise this winter because of the weather. He denies any chest pain or shortness of breath. He denies any palpitations or edema. He has gained 3 pounds since his last visit.  Current Outpatient Prescriptions on File Prior to Visit  Medication Sig Dispense Refill  . allopurinol (ZYLOPRIM) 300 MG tablet Take 300 mg by mouth daily.        Marland Kitchen aspirin 81 MG tablet Take 81 mg by mouth daily.        Marland Kitchen atorvastatin (LIPITOR) 20 MG tablet TAKE 1 TABLET (20 MG TOTAL) BY MOUTH DAILY.  90 tablet  3  . carvedilol (COREG) 6.25 MG tablet Take 1 tablet (6.25 mg total) by mouth 2 (two) times daily.  180 tablet  3  . lisinopril (PRINIVIL,ZESTRIL) 5 MG tablet Take 1 tablet (5 mg total) by mouth daily.  90 tablet  3   No current facility-administered medications on file prior to visit.    No Known Allergies  Past Medical History  Diagnosis Date  . 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 Depression   . Fracture     MULTIPLE  . Gout   . LV dysfunction     Past Surgical History  Procedure Laterality Date  . Coronary artery bypass graft    . Cardiac catheterization    . Tonsillectomy    . Multiple fractures      History  Smoking status  . Former Smoker -- 0.50 packs/day for 15 years  . Types: Cigarettes  . Quit date: 07/20/1968  Smokeless tobacco  . Never Used    History  Alcohol Use  . 1.8 oz/week  . 3 Cans of beer per week    Comment: OCCASSIONALY    Family History  Problem Relation Age of Onset  . Heart disease Father     Review of Systems: As noted in history of present illness. He did have laser surgery on his eye for a retinal hole. All other systems were reviewed and are negative.  Physical Exam: BP 110/64  Pulse 54  Ht 5' 10.5" (1.791 m)  Wt 197 lb 12.8 oz  (89.721 kg)  BMI 27.97 kg/m2  SpO2 98% He was a well-developed white male in no acute distress. His color is good. HEENT exam is unremarkable. He has no JVD or bruits. Lungs are clear. Cardiac exam reveals a regular rate and rhythm without gallop, murmur, or click. His sternum has healed well. Abdomen is soft and nontender without masses or bruits. Femoral and pedal pulses are good. He has no edema. LABORATORY DATA: ECG today demonstrates normal sinus rhythm with a rate of 51 beats per minute. Cannot rule out anterior infarct-old. Lab Results  Component Value Date   WBC 5.7 11/10/2012   HGB 13.4 11/10/2012   HCT 39.1 11/10/2012   PLT 160.0 11/10/2012   GLUCOSE 101* 11/10/2012   CHOL 107 11/10/2012   TRIG 202.0* 11/10/2012   HDL 27.30* 11/10/2012   LDLDIRECT 54.7 11/10/2012   LDLCALC 49 03/09/2011   ALT 18 11/10/2012   AST 21 11/10/2012   NA 134* 11/10/2012   K 4.9 11/10/2012   CL 102 11/10/2012   CREATININE 1.1 11/10/2012   BUN 15 11/10/2012   CO2 26 11/10/2012   TSH 1.536 08/19/2010  INR 1.30 08/20/2010   HGBA1C  Value: 5.3 (NOTE)                                                                       According to the ADA Clinical Practice Recommendations for 2011, when HbA1c is used as a screening test:   >=6.5%   Diagnostic of Diabetes Mellitus           (if abnormal result  is confirmed)  5.7-6.4%   Increased risk of developing Diabetes Mellitus  References:Diagnosis and Classification of Diabetes Mellitus,Diabetes Care,2011,34(Suppl 1):S62-S69 and Standards of Medical Care in         Diabetes - 2011,Diabetes Care,2011,34  (Suppl 1):S11-S61. 08/19/2010     Assessment / Plan: 1. Coronary disease status post anterior ST elevation myocardial infarction in January 2012. He underwent coronary bypass surgery. We will continue with his risk factor modification.  2. Hyperlipidemia. LDL is at goal.  He will continue with atorvastatin.  3. History of LV dysfunction post MI. Subsequent echocardiogram  following revascularization showed normalization with ejection fraction of 60%.

## 2012-11-17 NOTE — Patient Instructions (Signed)
Get more exercise   Continue your other therapy.  I will see you in 6 months.

## 2012-12-24 ENCOUNTER — Other Ambulatory Visit (HOSPITAL_COMMUNITY): Payer: Self-pay | Admitting: Cardiology

## 2012-12-26 ENCOUNTER — Other Ambulatory Visit (HOSPITAL_COMMUNITY): Payer: Self-pay

## 2012-12-26 DIAGNOSIS — I519 Heart disease, unspecified: Secondary | ICD-10-CM

## 2012-12-26 MED ORDER — LISINOPRIL 5 MG PO TABS: 5.0000 mg | ORAL_TABLET | Freq: Every day | ORAL | Status: DC

## 2012-12-26 NOTE — Telephone Encounter (Signed)
11/17/2012 9:00 AM Office Visit lisinopril (PRINIVIL,ZESTRIL) 5 MG tablet  Take 1 tablet (5 mg total) by mouth daily.   90 tablet   3  Patient Instructions  Get more exercise  Continue your other therapy. I will see you in 6 months.

## 2012-12-27 ENCOUNTER — Other Ambulatory Visit (HOSPITAL_COMMUNITY): Payer: Self-pay | Admitting: *Deleted

## 2012-12-27 DIAGNOSIS — I519 Heart disease, unspecified: Secondary | ICD-10-CM

## 2012-12-27 MED ORDER — CARVEDILOL 6.25 MG PO TABS: 6.2500 mg | ORAL_TABLET | Freq: Two times a day (BID) | ORAL | Status: DC

## 2012-12-27 NOTE — Telephone Encounter (Signed)
Fax Received. Refill Completed. Riad Wagley Chowoe (R.M.A)   

## 2013-06-01 ENCOUNTER — Encounter: Payer: Self-pay | Admitting: Cardiology

## 2013-06-01 ENCOUNTER — Ambulatory Visit (INDEPENDENT_AMBULATORY_CARE_PROVIDER_SITE_OTHER): Payer: Medicare HMO | Admitting: Cardiology

## 2013-06-01 VITALS — BP 120/74 | HR 59 | Ht 70.5 in | Wt 201.8 lb

## 2013-06-01 DIAGNOSIS — I25810 Atherosclerosis of coronary artery bypass graft(s) without angina pectoris: Secondary | ICD-10-CM

## 2013-06-01 DIAGNOSIS — I2109 ST elevation (STEMI) myocardial infarction involving other coronary artery of anterior wall: Secondary | ICD-10-CM

## 2013-06-01 DIAGNOSIS — E7800 Pure hypercholesterolemia, unspecified: Secondary | ICD-10-CM

## 2013-06-01 NOTE — Progress Notes (Signed)
And in   Joshua Mcpherson Date of Birth: 04-27-1946   History of Present Illness: Joshua Mcpherson is seen for followup today. 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 He subsequently had coronary bypass surgery. Post bypass his ejection fraction was normal at 60%. He reports he is feeling well. He admits that he hasn't done much exercise. He has a cough for the silver sneakers program and [facility] He denies any chest pain or shortness of breath. He denies any palpitations or edema.   Current Outpatient Prescriptions on File Prior to Visit  Medication Sig Dispense Refill  . allopurinol (ZYLOPRIM) 300 MG tablet Take 300 mg by mouth daily.        Marland Kitchen aspirin 81 MG tablet Take 81 mg by mouth daily.        Marland Kitchen atorvastatin (LIPITOR) 20 MG tablet TAKE 1 TABLET (20 MG TOTAL) BY MOUTH DAILY.  90 tablet  3  . carvedilol (COREG) 6.25 MG tablet Take 1 tablet (6.25 mg total) by mouth 2 (two) times daily.  180 tablet  3  . lisinopril (PRINIVIL,ZESTRIL) 5 MG tablet Take 1 tablet (5 mg total) by mouth daily.  90 tablet  3   No current facility-administered medications on file prior to visit.    No Known Allergies  Past Medical History  Diagnosis Date  . 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 Depression   . Fracture     MULTIPLE  . Gout   . LV dysfunction     Past Surgical History  Procedure Laterality Date  . Coronary artery bypass graft    . Cardiac catheterization    . Tonsillectomy    . Multiple fractures      History  Smoking status  . Former Smoker -- 0.50 packs/day for 15 years  . Types: Cigarettes  . Quit date: 07/20/1968  Smokeless tobacco  . Never Used    History  Alcohol Use  . 1.8 oz/week  . 3 Cans of beer per week    Comment: OCCASSIONALY    Family History  Problem Relation Age of Onset  . Heart disease Father     Review of Systems: As noted in history of present illness.  All other systems were reviewed and are negative.  Physical Exam: BP 120/74  Pulse 59  Ht 5' 10.5" (1.791 m)  Wt 201 lb 12.8 oz (91.536 kg)  BMI 28.54 kg/m2  SpO2 99% He was a well-developed white male in no acute distress.HEENT exam is unremarkable. He has no JVD or bruits. Lungs are clear. Cardiac exam reveals a regular rate and rhythm without gallop, murmur, or click. His sternum has healed well. Abdomen is soft and nontender without masses or bruits. Femoral and pedal pulses are good. He has no edema.  LABORATORY DATA:  Lab Results  Component Value Date   WBC 5.7 11/10/2012   HGB 13.4 11/10/2012   HCT 39.1 11/10/2012   PLT 160.0 11/10/2012   GLUCOSE 101* 11/10/2012   CHOL 107 11/10/2012   TRIG 202.0* 11/10/2012   HDL 27.30* 11/10/2012   LDLDIRECT 54.7 11/10/2012   LDLCALC 49 03/09/2011   ALT 18 11/10/2012   AST 21 11/10/2012   NA 134* 11/10/2012   K 4.9 11/10/2012   CL 102 11/10/2012   CREATININE 1.1 11/10/2012   BUN 15 11/10/2012   CO2 26 11/10/2012   TSH 1.536 08/19/2010   INR 1.30 08/20/2010  HGBA1C  Value: 5.3 (NOTE)                                                                       According to the ADA Clinical Practice Recommendations for 2011, when HbA1c is used as a screening test:   >=6.5%   Diagnostic of Diabetes Mellitus           (if abnormal result  is confirmed)  5.7-6.4%   Increased risk of developing Diabetes Mellitus  References:Diagnosis and Classification of Diabetes Mellitus,Diabetes Care,2011,34(Suppl 1):S62-S69 and Standards of Medical Care in         Diabetes - 2011,Diabetes Care,2011,34  (Suppl 1):S11-S61. 08/19/2010     Assessment / Plan: 1. Coronary disease status post anterior ST elevation myocardial infarction in January 2012. He underwent coronary bypass surgery. We will continue with his current medication. I recommended a stress echo within the next few months. He needs to increase his aerobic exercise.  2. Hyperlipidemia. LDL is at goal.  He will continue  with atorvastatin.  3. History of LV dysfunction post MI. Subsequent echocardiogram following revascularization showed normalization with ejection fraction of 60%.

## 2013-06-01 NOTE — Patient Instructions (Signed)
Continue your current therapy  We will schedule a stress test in the next few months. Let me know when you want to do this.  Get started back on your exercise program

## 2013-08-01 ENCOUNTER — Telehealth: Payer: Self-pay | Admitting: Cardiology

## 2013-08-01 DIAGNOSIS — I2109 ST elevation (STEMI) myocardial infarction involving other coronary artery of anterior wall: Secondary | ICD-10-CM

## 2013-08-01 DIAGNOSIS — E7800 Pure hypercholesterolemia, unspecified: Secondary | ICD-10-CM

## 2013-08-01 DIAGNOSIS — I25810 Atherosclerosis of coronary artery bypass graft(s) without angina pectoris: Secondary | ICD-10-CM

## 2013-08-01 NOTE — Telephone Encounter (Signed)
Returned call to patient spoke to wife she stated patient needs to schedule stress test.Dr.Jordan's 06/01/13 note says stress echo.Schedulers will call to schedule,instructions will be mailed.Advised to call back if does not get a call with appointment.

## 2013-08-01 NOTE — Telephone Encounter (Signed)
New problem   Pt has been waiting since November to sched a stress test. Stated someone was suppose to call him concerning his matter. No orders was in system. Please call pt

## 2013-08-25 ENCOUNTER — Ambulatory Visit (HOSPITAL_COMMUNITY): Payer: Medicare HMO | Attending: Cardiovascular Disease | Admitting: Radiology

## 2013-08-25 ENCOUNTER — Ambulatory Visit (HOSPITAL_COMMUNITY): Payer: Medicare HMO

## 2013-08-25 ENCOUNTER — Encounter: Payer: Self-pay | Admitting: Cardiovascular Disease

## 2013-08-25 DIAGNOSIS — I2510 Atherosclerotic heart disease of native coronary artery without angina pectoris: Secondary | ICD-10-CM | POA: Insufficient documentation

## 2013-08-25 DIAGNOSIS — I2109 ST elevation (STEMI) myocardial infarction involving other coronary artery of anterior wall: Secondary | ICD-10-CM

## 2013-08-25 DIAGNOSIS — Z8249 Family history of ischemic heart disease and other diseases of the circulatory system: Secondary | ICD-10-CM | POA: Insufficient documentation

## 2013-08-25 DIAGNOSIS — Z87891 Personal history of nicotine dependence: Secondary | ICD-10-CM | POA: Insufficient documentation

## 2013-08-25 DIAGNOSIS — E785 Hyperlipidemia, unspecified: Secondary | ICD-10-CM | POA: Insufficient documentation

## 2013-08-25 DIAGNOSIS — I219 Acute myocardial infarction, unspecified: Secondary | ICD-10-CM | POA: Insufficient documentation

## 2013-08-25 DIAGNOSIS — I25810 Atherosclerosis of coronary artery bypass graft(s) without angina pectoris: Secondary | ICD-10-CM

## 2013-08-25 DIAGNOSIS — E7800 Pure hypercholesterolemia, unspecified: Secondary | ICD-10-CM

## 2013-08-25 NOTE — Progress Notes (Signed)
Stress Echocardiogram performed.  

## 2013-08-30 ENCOUNTER — Encounter (HOSPITAL_COMMUNITY): Payer: Self-pay | Admitting: Pharmacy Technician

## 2013-08-30 ENCOUNTER — Encounter: Payer: Self-pay | Admitting: Cardiology

## 2013-08-30 ENCOUNTER — Ambulatory Visit (INDEPENDENT_AMBULATORY_CARE_PROVIDER_SITE_OTHER): Payer: Medicare HMO | Admitting: Cardiology

## 2013-08-30 ENCOUNTER — Ambulatory Visit
Admission: RE | Admit: 2013-08-30 | Discharge: 2013-08-30 | Disposition: A | Discharge status: Home / Self Care | Payer: Medicare PPO | Source: Ambulatory Visit | Attending: Cardiology | Admitting: Cardiology

## 2013-08-30 VITALS — BP 125/76 | HR 61 | Ht 70.5 in | Wt 204.1 lb

## 2013-08-30 DIAGNOSIS — I2109 ST elevation (STEMI) myocardial infarction involving other coronary artery of anterior wall: Secondary | ICD-10-CM

## 2013-08-30 DIAGNOSIS — I25810 Atherosclerosis of coronary artery bypass graft(s) without angina pectoris: Secondary | ICD-10-CM

## 2013-08-30 DIAGNOSIS — E7800 Pure hypercholesterolemia, unspecified: Secondary | ICD-10-CM

## 2013-08-30 LAB — CBC WITH DIFFERENTIAL/PLATELET
BASOS PCT: 0.2 % (ref 0.0–3.0)
Basophils Absolute: 0 10*3/uL (ref 0.0–0.1)
EOS PCT: 2.4 % (ref 0.0–5.0)
Eosinophils Absolute: 0.2 10*3/uL (ref 0.0–0.7)
HCT: 44.5 % (ref 39.0–52.0)
HEMOGLOBIN: 14.8 g/dL (ref 13.0–17.0)
LYMPHS PCT: 20.7 % (ref 12.0–46.0)
Lymphs Abs: 1.4 10*3/uL (ref 0.7–4.0)
MCHC: 33.2 g/dL (ref 30.0–36.0)
MCV: 91.3 fl (ref 78.0–100.0)
MONOS PCT: 6.9 % (ref 3.0–12.0)
Monocytes Absolute: 0.5 10*3/uL (ref 0.1–1.0)
NEUTROS ABS: 4.7 10*3/uL (ref 1.4–7.7)
NEUTROS PCT: 69.8 % (ref 43.0–77.0)
Platelets: 199 10*3/uL (ref 150.0–400.0)
RBC: 4.87 Mil/uL (ref 4.22–5.81)
RDW: 14 % (ref 11.5–14.6)
WBC: 6.8 10*3/uL (ref 4.5–10.5)

## 2013-08-30 LAB — BASIC METABOLIC PANEL
BUN: 15 mg/dL (ref 6–23)
CALCIUM: 9.1 mg/dL (ref 8.4–10.5)
CO2: 29 meq/L (ref 19–32)
CREATININE: 1.1 mg/dL (ref 0.4–1.5)
Chloride: 106 mEq/L (ref 96–112)
GFR: 72.87 mL/min (ref >60.00)
Glucose, Bld: 97 mg/dL (ref 70–99)
Potassium: 4.7 mEq/L (ref 3.5–5.1)
SODIUM: 141 meq/L (ref 135–145)

## 2013-08-30 LAB — PROTIME-INR
INR: 1.1 ratio — AB (ref 0.8–1.0)
Prothrombin Time: 11.5 s (ref 10.2–12.4)

## 2013-08-30 NOTE — Progress Notes (Signed)
And in   Rudolf N Marcano Date of Birth: 10/03/1945   History of Present Illness: Mr. Martire is seen for followup after his stress Echo. 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 He subsequently had coronary bypass surgery by Dr  Bartle with an LIMA graft to the LAD and vein grafts to the diagonal and OM. Post bypass his ejection fraction was normal at 60%.  He denies any chest pain other than occ. Twinges. No shortness of breath. He denies any palpitations or edema. Follow up stress Echo was arranged and is noted below.  Current Outpatient Prescriptions on File Prior to Visit  Medication Sig Dispense Refill  . allopurinol (ZYLOPRIM) 300 MG tablet Take 300 mg by mouth daily.        . aspirin 81 MG tablet Take 81 mg by mouth daily.        . atorvastatin (LIPITOR) 20 MG tablet TAKE 1 TABLET (20 MG TOTAL) BY MOUTH DAILY.  90 tablet  3  . carvedilol (COREG) 6.25 MG tablet Take 1 tablet (6.25 mg total) by mouth 2 (two) times daily.  180 tablet  3  . lisinopril (PRINIVIL,ZESTRIL) 5 MG tablet Take 1 tablet (5 mg total) by mouth daily.  90 tablet  3  . VIAGRA 100 MG tablet        No current facility-administered medications on file prior to visit.    No Known Allergies  Past Medical History  Diagnosis Date  . 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 Depression   . Fracture     MULTIPLE  . Gout   . LV dysfunction     Past Surgical History  Procedure Laterality Date  . Coronary artery bypass graft    . Cardiac catheterization    . Tonsillectomy    . Multiple fractures      History  Smoking status  . Former Smoker -- 0.50 packs/day for 15 years  . Types: Cigarettes  . Quit date: 07/20/1968  Smokeless tobacco  . Never Used    History  Alcohol Use  . 1.8 oz/week  . 3 Cans of beer per week    Comment: OCCASSIONALY    Family History  Problem Relation Age of Onset  . Heart disease Father     Review of Systems: As noted  in history of present illness. All other systems were reviewed and are negative.  Physical Exam: BP 125/76  Pulse 61  Ht 5' 10.5" (1.791 m)  Wt 204 lb 1.9 oz (92.588 kg)  BMI 28.86 kg/m2 He was a well-developed white male in no acute distress.HEENT exam is unremarkable. He has no JVD or bruits. Lungs are clear. Cardiac exam reveals a regular rate and rhythm without gallop, murmur, or click. His sternum has healed well. Abdomen is soft and nontender without masses or bruits. Radial pulses are 2+.Femoral and pedal pulses are good. He has no edema.  LABORATORY DATA:  Lab Results  Component Value Date   WBC 5.7 11/10/2012   HGB 13.4 11/10/2012   HCT 39.1 11/10/2012   PLT 160.0 11/10/2012   GLUCOSE 101* 11/10/2012   CHOL 107 11/10/2012   TRIG 202.0* 11/10/2012   HDL 27.30* 11/10/2012   LDLDIRECT 54.7 11/10/2012   LDLCALC 49 03/09/2011   ALT 18 11/10/2012   AST 21 11/10/2012   NA 134* 11/10/2012   K 4.9 11/10/2012   CL 102 11/10/2012   CREATININE 1.1 11/10/2012   BUN 15 11/10/2012     CO2 26 11/10/2012   TSH 1.536 08/19/2010   INR 1.30 08/20/2010   HGBA1C  Value: 5.3 (NOTE)                                                                       According to the ADA Clinical Practice Recommendations for 2011, when HbA1c is used as a screening test:   >=6.5%   Diagnostic of Diabetes Mellitus           (if abnormal result  is confirmed)  5.7-6.4%   Increased risk of developing Diabetes Mellitus  References:Diagnosis and Classification of Diabetes Mellitus,Diabetes Care,2011,34(Suppl 1):S62-S69 and Standards of Medical Care in         Diabetes - 2011,Diabetes Care,2011,34  (Suppl 1):S11-S61. 08/19/2010   Stress Echocardiography  Patient: Bovey, Caley N MR #: [id number] Study Date: 08/25/2013 Gender: M Age: 68 Height: 180.3cm Weight: 91.8kg BSA: 2.16m^2 Pt. Status: Room:  ORDERING Tangelia Sanson REFERRING Gabreille Dardis ATTENDING Nahser, Philip SONOGRAPHER Vanessa Poole, RDCS PERFORMING [facility],  Outpatient cc:  ------------------------------------------------------------  ------------------------------------------------------------ Indications: CAD of native vessels 414.01. MI - follow-up 410.92.  ------------------------------------------------------------ History: PMH: Acquired from the patient and from the patient's chart. Left ventricular dysfunction. PMH: Myocardial infarction. Risk factors: Family history of coronary artery disease. Former tobacco use. Dyslipidemia.  ------------------------------------------------------------ Study Conclusions  Stress ECG conclusions: The stress ECG was consistent with myocardial ischemia.  Impressions:  - Abnormal stress echo. Resting images show mild anteroseptal hypokinesis which worsens with exercise stress. EKG with stress shows ST depression in I II avF, v5 and v6 consistent with ischemia.  ------------------------------------------------------------ Labs, prior tests, procedures, and surgery: Echocardiography (2012). EF was 60%.  Catheterization. The study demonstrated coronary artery disease. Coronary artery bypass grafting. Bruce protocol. Stress echocardiography. Height: Height: 180.3cm. Height: 71in. Weight: Weight: 91.8kg. Weight: 202lb. Body mass index: BMI: 28.2kg/m^2. Body surface area: BSA: 2.16m^2. Blood pressure: 128/80. Patient status: Outpatient.  ------------------------------------------------------------  ------------------------------------------------------------ Stress protocol:  +---------------------+---+------------+---+--------------+ Stage HR BP (mmHg) SatSymptoms  +---------------------+---+------------+---+--------------+ Baseline 58 128/80 (96) 96%None  +---------------------+---+------------+---+--------------+ Stage 1 97 166/69 (101)96%None  +---------------------+---+------------+---+--------------+ Stage 2 139152/68 (96) 95%None   +---------------------+---+------------+---+--------------+ Stage 3 146---------------Severe fatigue +---------------------+---+------------+---+--------------+ Immediate post stress151------------96%None  +---------------------+---+------------+---+--------------+ Recovery; 1 min 123136/70 (92) ---None  +---------------------+---+------------+---+--------------+ Recovery; 2 min 104---------------None  +---------------------+---+------------+---+--------------+ Recovery; 3 min 87 130/67 (88) 97%None  +---------------------+---+------------+---+--------------+ Recovery; 4 min 85 ---------------None  +---------------------+---+------------+---+--------------+ Recovery; 5 min 82 133/59 (84) ---None  +---------------------+---+------------+---+--------------+  ------------------------------------------------------------ Stress results: Maximal heart rate during stress was 162bpm (104% of maximal predicted heart rate). The maximal predicted heart rate was 155bpm.The target heart rate was achieved. The heart rate response to stress was normal. There was a normal resting blood pressure with an appropriate response to stress. The rate-pressure product for the peak heart rate and blood pressure was 21128mm Hg/min. The patient experienced no chest pain during stress.  ------------------------------------------------------------ Stress ECG: The stress ECG was consistent with myocardial ischemia.  With exercise patient develops ST depression in I, II, avFV5 and V6 consistent with ischemia.  ------------------------------------------------------------ Baseline:  - The estimated LV ejection fraction was 60%. - Mild hypokinesis of the mid anteroseptal LV myocardium. Peak stress:  - The estimated LV ejection fraction was 60%. - Severe hypokinesis of the basal and mid anteroseptal  LV myocardium.  ------------------------------------------------------------   Prepared and Electronically Authenticated by  Brackbill, Thomas [id number]:[id number]   Assessment / Plan: 1. Coronary disease status post anterior ST elevation myocardial infarction in January 2012. He underwent coronary bypass surgery. We will continue with his current medication. Stress Echo was personally reviewed and I agree with interpretation. Although he is asymptomatic he does have ECG and Echo findings for ischemia. I have recommended Cardiac cath to assess graft patency. We will proceed this Friday. The procedure and risks were reviewed including but not limited to death, myocardial infarction, stroke, arrythmias, bleeding, transfusion, emergency surgery, dye allergy, or renal dysfunction. The patient voices understanding and is agreeable to proceed.   2. Hyperlipidemia. LDL is at goal.  He will continue with atorvastatin.  3. History of LV dysfunction post MI. Subsequent echocardiogram following revascularization showed normalization with ejection fraction of 60%.  

## 2013-09-01 ENCOUNTER — Ambulatory Visit (HOSPITAL_COMMUNITY)
Admission: RE | Admit: 2013-09-01 | Discharge: 2013-09-01 | Disposition: A | Discharge status: Home / Self Care | Payer: Medicare HMO | Source: Ambulatory Visit | Attending: Cardiology | Admitting: Cardiology

## 2013-09-01 ENCOUNTER — Encounter (HOSPITAL_COMMUNITY)
Admission: RE | Disposition: A | Discharge status: Home / Self Care | Payer: Medicare PPO | Source: Ambulatory Visit | Attending: Cardiology

## 2013-09-01 DIAGNOSIS — Z87891 Personal history of nicotine dependence: Secondary | ICD-10-CM | POA: Insufficient documentation

## 2013-09-01 DIAGNOSIS — I2510 Atherosclerotic heart disease of native coronary artery without angina pectoris: Secondary | ICD-10-CM

## 2013-09-01 DIAGNOSIS — Z7982 Long term (current) use of aspirin: Secondary | ICD-10-CM | POA: Insufficient documentation

## 2013-09-01 DIAGNOSIS — I25810 Atherosclerosis of coronary artery bypass graft(s) without angina pectoris: Secondary | ICD-10-CM

## 2013-09-01 DIAGNOSIS — I252 Old myocardial infarction: Secondary | ICD-10-CM | POA: Insufficient documentation

## 2013-09-01 HISTORY — PX: LEFT HEART CATHETERIZATION WITH CORONARY/GRAFT ANGIOGRAM: SHX5450

## 2013-09-01 SURGERY — LEFT HEART CATHETERIZATION WITH CORONARY/GRAFT ANGIOGRAM
Anesthesia: LOCAL

## 2013-09-01 MED ORDER — HEPARIN (PORCINE) IN NACL 2-0.9 UNIT/ML-% IJ SOLN
INTRAMUSCULAR | Status: AC
  Filled 2013-09-01: qty 1000

## 2013-09-01 MED ORDER — SODIUM CHLORIDE 0.9 % IV SOLN
INTRAVENOUS | Status: DC
  Administered 2013-09-01: 06:00:00 via INTRAVENOUS

## 2013-09-01 MED ORDER — LIDOCAINE HCL (PF) 1 % IJ SOLN
INTRAMUSCULAR | Status: AC
  Filled 2013-09-01: qty 30

## 2013-09-01 MED ORDER — ASPIRIN 81 MG PO CHEW
81.0000 mg | CHEWABLE_TABLET | ORAL | Status: AC
  Administered 2013-09-01: 81 mg via ORAL

## 2013-09-01 MED ORDER — SODIUM CHLORIDE 0.9 % IV SOLN: 1.0000 mL/kg/h | INTRAVENOUS | Status: DC

## 2013-09-01 MED ORDER — MIDAZOLAM HCL 2 MG/2ML IJ SOLN
INTRAMUSCULAR | Status: AC
  Filled 2013-09-01: qty 2

## 2013-09-01 MED ORDER — SODIUM CHLORIDE 0.9 % IJ SOLN: 3.0000 mL | Freq: Two times a day (BID) | INTRAMUSCULAR | Status: DC

## 2013-09-01 MED ORDER — SODIUM CHLORIDE 0.9 % IJ SOLN: 3.0000 mL | INTRAMUSCULAR | Status: DC | PRN

## 2013-09-01 MED ORDER — SODIUM CHLORIDE 0.9 % IV SOLN: 250.0000 mL | INTRAVENOUS | Status: DC | PRN

## 2013-09-01 MED ORDER — FENTANYL CITRATE 0.05 MG/ML IJ SOLN
INTRAMUSCULAR | Status: AC
  Filled 2013-09-01: qty 2

## 2013-09-01 MED ORDER — VERAPAMIL HCL 2.5 MG/ML IV SOLN
INTRAVENOUS | Status: AC
  Filled 2013-09-01: qty 2

## 2013-09-01 MED ORDER — HEPARIN SODIUM (PORCINE) 1000 UNIT/ML IJ SOLN
INTRAMUSCULAR | Status: AC
  Filled 2013-09-01: qty 1

## 2013-09-01 MED ORDER — ASPIRIN 81 MG PO CHEW
CHEWABLE_TABLET | ORAL | Status: AC
  Filled 2013-09-01: qty 1

## 2013-09-01 NOTE — Discharge Instructions (Signed)

## 2013-09-01 NOTE — Interval H&P Note (Signed)
History and Physical Interval Note:  09/01/2013 7:47 AM  Joshua Mcpherson  has presented today for surgery, with the diagnosis of Chest pain  The various methods of treatment have been discussed with the patient and family. After consideration of risks, benefits and other options for treatment, the patient has consented to  Procedure(s): LEFT HEART CATHETERIZATION WITH CORONARY/GRAFT ANGIOGRAM (N/A) as a surgical intervention .  The patient's history has been reviewed, patient examined, no change in status, stable for surgery.  I have reviewed the patient's chart and labs.  Questions were answered to the patient's satisfaction.   Cath Lab Visit (complete for each Cath Lab visit)  Clinical Evaluation Leading to the Procedure:   ACS: no  Non-ACS:    Anginal Classification: CCS I  Anti-ischemic medical therapy: Maximal Therapy (2 or more classes of medications)  Non-Invasive Test Results: High-risk stress test findings: cardiac mortality >3%/year  Prior CABG: Previous CABG        Theron Arista [facility] 09/01/2013 7:47 AM

## 2013-09-01 NOTE — H&P (View-Only) (Signed)
And in   Joshua Mcpherson Date of Birth: [birth year]   History of Present Illness: Joshua Mcpherson is seen for followup after his stress Echo. 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 He subsequently had coronary bypass surgery by Dr  Laneta Simmers with an LIMA graft to the LAD and vein grafts to the diagonal and OM. Post bypass his ejection fraction was normal at 60%.  He denies any chest pain other than occ. Twinges. No shortness of breath. He denies any palpitations or edema. Follow up stress Echo was arranged and is noted below.  Current Outpatient Prescriptions on File Prior to Visit  Medication Sig Dispense Refill  . allopurinol (ZYLOPRIM) 300 MG tablet Take 300 mg by mouth daily.        Marland Kitchen aspirin 81 MG tablet Take 81 mg by mouth daily.        Marland Kitchen atorvastatin (LIPITOR) 20 MG tablet TAKE 1 TABLET (20 MG TOTAL) BY MOUTH DAILY.  90 tablet  3  . carvedilol (COREG) 6.25 MG tablet Take 1 tablet (6.25 mg total) by mouth 2 (two) times daily.  180 tablet  3  . lisinopril (PRINIVIL,ZESTRIL) 5 MG tablet Take 1 tablet (5 mg total) by mouth daily.  90 tablet  3  . VIAGRA 100 MG tablet        No current facility-administered medications on file prior to visit.    No Known Allergies  Past Medical History  Diagnosis Date  . 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 Depression   . Fracture     MULTIPLE  . Gout   . LV dysfunction     Past Surgical History  Procedure Laterality Date  . Coronary artery bypass graft    . Cardiac catheterization    . Tonsillectomy    . Multiple fractures      History  Smoking status  . Former Smoker -- 0.50 packs/day for 15 years  . Types: Cigarettes  . Quit date: 07/20/1968  Smokeless tobacco  . Never Used    History  Alcohol Use  . 1.8 oz/week  . 3 Cans of beer per week    Comment: OCCASSIONALY    Family History  Problem Relation Age of Onset  . Heart disease Father     Review of Systems: As noted  in history of present illness. All other systems were reviewed and are negative.  Physical Exam: BP 125/76  Pulse 61  Ht 5' 10.5" (1.791 m)  Wt 204 lb 1.9 oz (92.588 kg)  BMI 28.86 kg/m2 He was a well-developed white male in no acute distress.HEENT exam is unremarkable. He has no JVD or bruits. Lungs are clear. Cardiac exam reveals a regular rate and rhythm without gallop, murmur, or click. His sternum has healed well. Abdomen is soft and nontender without masses or bruits. Radial pulses are 2+.Femoral and pedal pulses are good. He has no edema.  LABORATORY DATA:  Lab Results  Component Value Date   WBC 5.7 11/10/2012   HGB 13.4 11/10/2012   HCT 39.1 11/10/2012   PLT 160.0 11/10/2012   GLUCOSE 101* 11/10/2012   CHOL 107 11/10/2012   TRIG 202.0* 11/10/2012   HDL 27.30* 11/10/2012   LDLDIRECT 54.7 11/10/2012   LDLCALC 49 03/09/2011   ALT 18 11/10/2012   AST 21 11/10/2012   NA 134* 11/10/2012   K 4.9 11/10/2012   CL 102 11/10/2012   CREATININE 1.1 11/10/2012   BUN 15 11/10/2012  CO2 26 11/10/2012   TSH 1.536 08/19/2010   INR 1.30 08/20/2010   HGBA1C  Value: 5.3 (NOTE)                                                                       According to the ADA Clinical Practice Recommendations for 2011, when HbA1c is used as a screening test:   >=6.5%   Diagnostic of Diabetes Mellitus           (if abnormal result  is confirmed)  5.7-6.4%   Increased risk of developing Diabetes Mellitus  References:Diagnosis and Classification of Diabetes Mellitus,Diabetes Care,2011,34(Suppl 1):S62-S69 and Standards of Medical Care in         Diabetes - 2011,Diabetes Care,2011,34  (Suppl 1):S11-S61. 08/19/2010   Stress Echocardiography  Patient: Joshua, Mcpherson MR #: [id number] Study Date: 08/25/2013 Gender: M Age: 68 Height: 180.3cm Weight: 91.8kg BSA: 2.41m^2 Pt. Status: Room:  ORDERING [place], Peter REFERRING [place], Peter ATTENDING Nahser, Philip SONOGRAPHER Junious Dresser, RDCS PERFORMING [facility],  Outpatient cc:  ------------------------------------------------------------  ------------------------------------------------------------ Indications: CAD of native vessels 414.01. MI - follow-up 410.92.  ------------------------------------------------------------ History: PMH: Acquired from the patient and from the patient's chart. Left ventricular dysfunction. PMH: Myocardial infarction. Risk factors: Family history of coronary artery disease. Former tobacco use. Dyslipidemia.  ------------------------------------------------------------ Study Conclusions  Stress ECG conclusions: The stress ECG was consistent with myocardial ischemia.  Impressions:  - Abnormal stress echo. Resting images show mild anteroseptal hypokinesis which worsens with exercise stress. EKG with stress shows ST depression in I II avF, v5 and v6 consistent with ischemia.  ------------------------------------------------------------ Labs, prior tests, procedures, and surgery: Echocardiography (2012). EF was 60%.  Catheterization. The study demonstrated coronary artery disease. Coronary artery bypass grafting. Bruce protocol. Stress echocardiography. Height: Height: 180.3cm. Height: 71in. Weight: Weight: 91.8kg. Weight: 202lb. Body mass index: BMI: 28.2kg/m^2. Body surface area: BSA: 2.70m^2. Blood pressure: 128/80. Patient status: Outpatient.  ------------------------------------------------------------  ------------------------------------------------------------ Stress protocol:  +---------------------+---+------------+---+--------------+ Stage HR BP (mmHg) SatSymptoms  +---------------------+---+------------+---+--------------+ Baseline 58 128/80 (96) 96%None  +---------------------+---+------------+---+--------------+ Stage 1 97 166/69 (101)96%None  +---------------------+---+------------+---+--------------+ Stage 2 139152/68 (96) 95%None   +---------------------+---+------------+---+--------------+ Stage 3 146---------------Severe fatigue +---------------------+---+------------+---+--------------+ Immediate post stress151------------96%None  +---------------------+---+------------+---+--------------+ Recovery; 1 min 123136/70 (92) ---None  +---------------------+---+------------+---+--------------+ Recovery; 2 min 104---------------None  +---------------------+---+------------+---+--------------+ Recovery; 3 min 87 130/67 (88) 97%None  +---------------------+---+------------+---+--------------+ Recovery; 4 min 85 ---------------None  +---------------------+---+------------+---+--------------+ Recovery; 5 min 82 133/59 (84) ---None  +---------------------+---+------------+---+--------------+  ------------------------------------------------------------ Stress results: Maximal heart rate during stress was 162bpm (104% of maximal predicted heart rate). The maximal predicted heart rate was 155bpm.The target heart rate was achieved. The heart rate response to stress was normal. There was a normal resting blood pressure with an appropriate response to stress. The rate-pressure product for the peak heart rate and blood pressure was Hg/min. The patient experienced no chest pain during stress.  ------------------------------------------------------------ Stress ECG: The stress ECG was consistent with myocardial ischemia.  With exercise patient develops ST depression in I, II, avFV5 and V6 consistent with ischemia.  ------------------------------------------------------------ Baseline:  - The estimated LV ejection fraction was 60%. - Mild hypokinesis of the mid anteroseptal LV myocardium. Peak stress:  - The estimated LV ejection fraction was 60%. - Severe hypokinesis of the basal and mid anteroseptal  LV myocardium.  ------------------------------------------------------------  Prepared and Electronically Authenticated by  Cassell ClementBrackbill, Thomas 2015-02-06T17:[id number]   Assessment / Plan: 1. Coronary disease status post anterior ST elevation myocardial infarction in January 2012. He underwent coronary bypass surgery. We will continue with his current medication. Stress Echo was personally reviewed and I agree with interpretation. Although he is asymptomatic he does have ECG and Echo findings for ischemia. I have recommended Cardiac cath to assess graft patency. We will proceed this Friday. The procedure and risks were reviewed including but not limited to death, myocardial infarction, stroke, arrythmias, bleeding, transfusion, emergency surgery, dye allergy, or renal dysfunction. The patient voices understanding and is agreeable to proceed.   2. Hyperlipidemia. LDL is at goal.  He will continue with atorvastatin.  3. History of LV dysfunction post MI. Subsequent echocardiogram following revascularization showed normalization with ejection fraction of 60%.

## 2013-09-01 NOTE — CV Procedure (Signed)
    Cardiac Catheterization Procedure Note  Name: JESAIAH TOMME MRN: [id number] DOB: 26-Feb-1946  Procedure: Left Heart Cath, Selective Coronary Angiography, SVG angiography, LIMA angiography,  LV angiography  Indication: 68 yo WM with history of CAD s/p CABG. Recent atypical chest pain. Stress Echo was abnormal indicating ischemia in the anterior and septal regions.    Procedural Details: The left wrist was prepped, draped, and anesthetized with 1% lidocaine. Using the modified Seldinger technique, a 5 French sheath was introduced into the left radial artery. 3 mg of verapamil was administered through the sheath, weight-based unfractionated heparin was administered intravenously. Standard Judkins catheters were used for selective coronary angiography and left ventriculography. Catheter exchanges were performed over an exchange length guidewire. There were no immediate procedural complications. A TR band was used for radial hemostasis at the completion of the procedure.  The patient was transferred to the post catheterization recovery area for further monitoring.  Procedural Findings: Hemodynamics: AO 127/66 mean 94 mm Hg LV 128/17 mm Hg  Coronary angiography: Coronary dominance: right  Left mainstem:  20% distal.   Left anterior descending (LAD): Long 95% proximal. Mid to distal LAD fills competitively from the IMA graft.  Left circumflex (LCx): 40% ostial LCx. The LCx gives rise to a single OM branch. There is a 90% stenosis in the OM at the anastomosis site with the SVG. The OM is fairly small.  Right coronary artery (RCA): Large dominant vessel. Minor wall irregularities.  SVG to a large diagonal branch is widely patent.   SVG to the OM is occluded proximally.  LIMA to the LAD is widely patent.   Left ventriculography: Left ventricular systolic function is normal, LVEF is estimated at 55-65%, there is no significant mitral regurgitation   Final Conclusions:   1. 2  vessel obstructive CAD 2. Patent LIMA to the LAD 3. Patent SVG to the diagonal. 4. Occluded SVG to the OM 5. Normal LV function  Recommendations: I would recommend continued medical management. He has minimal chest pain. The findings on his stress test do not correlate with findings on angiography with normal perfusion of the anterior and septal walls. Overall prognosis is good. If he were to develop significant limiting angina despite medical therapy we could consider PCI of the OM but again this vessel is fairly small.   Theron Arista [facility] 09/01/2013, 8:46 AM

## 2013-09-08 ENCOUNTER — Encounter: Payer: Self-pay | Admitting: Cardiology

## 2013-09-15 ENCOUNTER — Encounter: Payer: Medicare HMO | Admitting: Nurse Practitioner

## 2013-10-02 ENCOUNTER — Other Ambulatory Visit: Payer: Self-pay | Admitting: Cardiology

## 2013-10-04 ENCOUNTER — Encounter: Payer: Self-pay | Admitting: Nurse Practitioner

## 2013-10-04 ENCOUNTER — Ambulatory Visit (INDEPENDENT_AMBULATORY_CARE_PROVIDER_SITE_OTHER): Payer: Medicare HMO | Admitting: Nurse Practitioner

## 2013-10-04 VITALS — BP 100/66 | HR 56 | Ht 70.5 in | Wt 202.8 lb

## 2013-10-04 DIAGNOSIS — Z9889 Other specified postprocedural states: Secondary | ICD-10-CM

## 2013-10-04 DIAGNOSIS — I25810 Atherosclerosis of coronary artery bypass graft(s) without angina pectoris: Secondary | ICD-10-CM

## 2013-10-04 DIAGNOSIS — E785 Hyperlipidemia, unspecified: Secondary | ICD-10-CM

## 2013-10-04 LAB — LIPID PANEL
Cholesterol: 105 mg/dL (ref 0–200)
HDL: 28.9 mg/dL — ABNORMAL LOW (ref >39.00)
LDL Cholesterol: 42 mg/dL (ref 0–99)
Total CHOL/HDL Ratio: 4
Triglycerides: 169 mg/dL — ABNORMAL HIGH (ref 0.0–149.0)
VLDL: 33.8 mg/dL (ref 0.0–40.0)

## 2013-10-04 LAB — HEPATIC FUNCTION PANEL
ALT: 18 U/L (ref 0–53)
AST: 18 U/L (ref 0–37)
Albumin: 3.8 g/dL (ref 3.5–5.2)
Alkaline Phosphatase: 82 U/L (ref 39–117)
Bilirubin, Direct: 0.1 mg/dL (ref 0.0–0.3)
Total Bilirubin: 0.9 mg/dL (ref 0.3–1.2)
Total Protein: 6.9 g/dL (ref 6.0–8.3)

## 2013-10-04 MED ORDER — NITROGLYCERIN 0.4 MG SL SUBL: 0.4000 mg | SUBLINGUAL_TABLET | SUBLINGUAL | Status: DC | PRN

## 2013-10-04 NOTE — Patient Instructions (Addendum)
I think you are doing well  Keep exercising  I have sent in a RX for NTG - Use your NTG under your tongue for recurrent chest pain. May take one tablet every 5 minutes. If you are still having discomfort after 3 tablets in 15 minutes, call 911.  Do not use NTG within 24 hours of using Viagra - just call EMS  We will check labs today  See Dr.Jordan in 6 months  Call the [facility] office at [phone] if you have any questions, problems or concerns.

## 2013-10-04 NOTE — Progress Notes (Signed)
Joshua Mcpherson Date of Birth: 11-01-1945 Medical Record #[id number]  History of Present Illness: Joshua Mcpherson is seen back today for a post hospital visit. Seen for Dr. [place]. 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 Post bypass his ejection fraction was normal at 60%. Other issues include depression, gout, LD and HTN.  Last seen here this past February - had had an abnormal stress echo - was referred on for cardiac cath. Will continue with medical management.   Comes in today. Here with his wife. He is doing well. No real symptoms. Will have a fleeting pain in his chest but that has been chronic. He is exercising. Feels good on his medicines. No recent lipids. Not short of breath. Energy level is ok. Left wrist cath site looks good.   Current Outpatient Prescriptions  Medication Sig Dispense Refill  . allopurinol (ZYLOPRIM) 300 MG tablet Take 300 mg by mouth every morning.       Marland Kitchen aspirin EC 81 MG tablet Take 81 mg by mouth every morning.      Marland Kitchen atorvastatin (LIPITOR) 20 MG tablet TAKE 1 TABLET (20 MG TOTAL) BY MOUTH DAILY.  90 tablet  0  . carvedilol (COREG) 6.25 MG tablet Take 6.25 mg by mouth 2 (two) times daily with a meal.      . lisinopril (PRINIVIL,ZESTRIL) 5 MG tablet Take 5 mg by mouth every morning.      Marland Kitchen VIAGRA 100 MG tablet Take 100 mg by mouth as needed for erectile dysfunction.        No current facility-administered medications for this visit.    No Known Allergies  Past Medical History  Diagnosis Date  . 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 Depression   . Fracture     MULTIPLE  . Gout   . LV dysfunction     Past Surgical History  Procedure Laterality Date  . Coronary artery bypass graft    . Cardiac catheterization    . Tonsillectomy    . Multiple fractures      History  Smoking status  . Former Smoker -- 0.50  packs/day for 15 years  . Types: Cigarettes  . Quit date: 07/20/1968  Smokeless tobacco  . Never Used    History  Alcohol Use  . 1.8 oz/week  . 3 Cans of beer per week    Comment: OCCASSIONALY    Family History  Problem Relation Age of Onset  . Heart disease Father     Review of Systems: The review of systems is per the HPI.  All other systems were reviewed and are negative.  Physical Exam: BP 100/66  Pulse 56  Ht 5' 10.5" (1.791 m)  Wt 202 lb 12.8 oz (91.989 kg)  BMI 28.68 kg/m2 Patient is very pleasant and in no acute distress. Skin is warm and dry. Color is normal.  HEENT is unremarkable. Normocephalic/atraumatic. PERRL. Sclera are nonicteric. Neck is supple. No masses. No JVD. Lungs are clear. Cardiac exam shows a regular rate and rhythm. Abdomen is soft. Extremities are without edema. Gait and ROM are intact. No gross neurologic deficits noted.  LABORATORY DATA:   Lab Results  Component Value Date   WBC 6.8 08/30/2013   HGB 14.8 08/30/2013   HCT 44.5 08/30/2013   PLT 199.0 08/30/2013   GLUCOSE 97 08/30/2013  CHOL 107 11/10/2012   TRIG 202.0* 11/10/2012   HDL 27.30* 11/10/2012   LDLDIRECT 54.7 11/10/2012   LDLCALC 49 03/09/2011   ALT 18 11/10/2012   AST 21 11/10/2012   NA 141 08/30/2013   K 4.7 08/30/2013   CL 106 08/30/2013   CREATININE 1.1 08/30/2013   BUN 15 08/30/2013   CO2 29 08/30/2013   TSH 1.536 08/19/2010   INR 1.1* 08/30/2013   HGBA1C  Value: 5.3 (NOTE)                                                                       According to the ADA Clinical Practice Recommendations for 2011, when HbA1c is used as a screening test:   >=6.5%   Diagnostic of Diabetes Mellitus           (if abnormal result  is confirmed)  5.7-6.4%   Increased risk of developing Diabetes Mellitus  References:Diagnosis and Classification of Diabetes Mellitus,Diabetes Care,2011,34(Suppl 1):S62-S69 and Standards of Medical Care in         Diabetes - 2011,Diabetes Care,2011,34  (Suppl 1):S11-S61.  08/19/2010    Coronary angiography:  Coronary dominance: right  Left mainstem: 20% distal.  Left anterior descending (LAD): Long 95% proximal. Mid to distal LAD fills competitively from the IMA graft.  Left circumflex (LCx): 40% ostial LCx. The LCx gives rise to a single OM branch. There is a 90% stenosis in the OM at the anastomosis site with the SVG. The OM is fairly small.  Right coronary artery (RCA): Large dominant vessel. Minor wall irregularities.  SVG to a large diagonal branch is widely patent.  SVG to the OM is occluded proximally.  LIMA to the LAD is widely patent.  Left ventriculography: Left ventricular systolic function is normal, LVEF is estimated at 55-65%, there is no significant mitral regurgitation  Final Conclusions:  1. 2 vessel obstructive CAD  2. Patent LIMA to the LAD  3. Patent SVG to the diagonal.  4. Occluded SVG to the OM  5. Normal LV function  Recommendations: I would recommend continued medical management. He has minimal chest pain. The findings on his stress test do not correlate with findings on angiography with normal perfusion of the anterior and septal walls. Overall prognosis is good. If he were to develop significant limiting angina despite medical therapy we could consider PCI of the OM but again this vessel is fairly small.   Joshua Mcpherson,Joshua Mcpherson  09/01/2013, 8:46 AM   Assessment / Plan: 1. CAD - prior MI with CABG - s/p recent abnormal stress echo followed by cardiac cath - will continue with medical management. He is doing well clinically. Continue with CV risk factor modification.   2. HLD - check lipids today   3. HTN - BP looks great.  Will keep him on his current regimen. Check lipids and LFTs today. See again in 6 months. I have sent in a RX for NTG for him to have on hand. Reminded to not use if he has used Viagra for 24 hours.   Patient is agreeable to this plan and will call if any problems develop in the interim.   Joshua MacadamiaLori C. Seeley Southgate,  RN, ANP-C [facility] [address]  [place], [place]  [address] [phone]

## 2013-10-05 ENCOUNTER — Telehealth: Payer: Self-pay | Admitting: Nurse Practitioner

## 2013-10-05 NOTE — Telephone Encounter (Signed)
Pt aware of results & will return for repeat fasting labs in 6 months as per Norma Fredrickson NP Mylo Red RN

## 2013-10-05 NOTE — Telephone Encounter (Signed)
New message     Pt saw [name] yesterday----want lab results

## 2014-01-01 ENCOUNTER — Other Ambulatory Visit: Payer: Self-pay | Admitting: Cardiology

## 2014-01-20 ENCOUNTER — Other Ambulatory Visit (HOSPITAL_COMMUNITY): Payer: Self-pay | Admitting: Cardiology

## 2014-01-22 ENCOUNTER — Other Ambulatory Visit: Payer: Self-pay | Admitting: *Deleted

## 2014-01-22 MED ORDER — LISINOPRIL 5 MG PO TABS
5.0000 mg | ORAL_TABLET | Freq: Every morning | ORAL | Status: DC
Start: 2014-01-22 — End: 2014-04-20

## 2014-02-05 ENCOUNTER — Other Ambulatory Visit: Payer: Self-pay

## 2014-02-05 MED ORDER — CARVEDILOL 6.25 MG PO TABS: 6.2500 mg | ORAL_TABLET | Freq: Two times a day (BID) | ORAL | Status: DC

## 2014-04-10 ENCOUNTER — Ambulatory Visit (INDEPENDENT_AMBULATORY_CARE_PROVIDER_SITE_OTHER): Payer: Medicare HMO | Admitting: Cardiology

## 2014-04-10 ENCOUNTER — Encounter: Payer: Self-pay | Admitting: Cardiology

## 2014-04-10 VITALS — BP 124/64 | HR 52 | Ht 70.5 in | Wt 206.5 lb

## 2014-04-10 DIAGNOSIS — I209 Angina pectoris, unspecified: Secondary | ICD-10-CM

## 2014-04-10 DIAGNOSIS — I25810 Atherosclerosis of coronary artery bypass graft(s) without angina pectoris: Secondary | ICD-10-CM

## 2014-04-10 DIAGNOSIS — E7800 Pure hypercholesterolemia, unspecified: Secondary | ICD-10-CM

## 2014-04-10 DIAGNOSIS — I25709 Atherosclerosis of coronary artery bypass graft(s), unspecified, with unspecified angina pectoris: Secondary | ICD-10-CM

## 2014-04-10 NOTE — Progress Notes (Signed)
[name] Date of Birth: Dec 24, 1945 Medical Record #[id number]  History of Present Illness: Mr. Silvestro is seen for a follow up visit.  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 Post bypass his ejection fraction was normal at 60%. Other issues include depression, gout, LD and HTN.  Last seen here this past February - had had an abnormal stress echo - was referred on for cardiac cath. This demonstrated patent grafts to the diagonal and LAD. The graft to the OM was occluded. This vessel was small and medical therapy recommended. Native RCA was patent. EF normal.  Comes in today. Here with his wife. He is doing well. Has mild chest discomfort and fatigue after playing a round of golf.  Not short of breath. Energy level is ok. He has not been exercising much other than playing golf twice a week. Diet has been quite poor recently and he has gained 5 lbs.  Current Outpatient Prescriptions  Medication Sig Dispense Refill  . allopurinol (ZYLOPRIM) 300 MG tablet Take 300 mg by mouth every morning.       Marland Kitchen aspirin EC 81 MG tablet Take 81 mg by mouth every morning.      Marland Kitchen atorvastatin (LIPITOR) 20 MG tablet TAKE 1 TABLET (20 MG TOTAL) BY MOUTH DAILY.  90 tablet  0  . carvedilol (COREG) 6.25 MG tablet Take 1 tablet (6.25 mg total) by mouth 2 (two) times daily with a meal.  180 tablet  1  . lisinopril (PRINIVIL,ZESTRIL) 5 MG tablet Take 1 tablet (5 mg total) by mouth every morning.  90 tablet  0  . nitroGLYCERIN (NITROSTAT) 0.4 MG SL tablet Place 1 tablet (0.4 mg total) under the tongue every 5 (five) minutes as needed for chest pain.  25 tablet  3  . VIAGRA 100 MG tablet Take 100 mg by mouth as needed for erectile dysfunction.        No current facility-administered medications for this visit.    No Known Allergies  Past Medical History  Diagnosis Date  . Anterolateral  myocardial infarction 08/19/10    STEMI  . Depression   . Fracture     MULTIPLE  . Gout   . LV dysfunction     Past Surgical History  Procedure Laterality Date  . Coronary artery bypass graft    . Cardiac catheterization    . Tonsillectomy    . Multiple fractures      History  Smoking status  . Former Smoker -- 0.50 packs/day for 15 years  . Types: Cigarettes  . Quit date: 07/20/1968  Smokeless tobacco  . Never Used    History  Alcohol Use  . 1.8 oz/week  . 3 Cans of beer per week    Comment: OCCASSIONALY    Family History  Problem Relation Age of Onset  . Heart disease Father     Review of Systems: The review of systems is per the HPI.  All other systems were reviewed and are negative.  Physical Exam: BP 124/64  Pulse 52  Ht 5' 10.5" (1.791 m)  Wt 206 lb 8 oz (93.668 kg)  BMI 29.20 kg/m2 Patient is very pleasant and in no acute distress. Skin is warm and dry. Color is normal.  HEENT is unremarkable. Normocephalic/atraumatic. PERRL. Sclera are nonicteric. Neck is supple. No masses. No JVD. Lungs are  clear. Cardiac exam shows a regular rate and rhythm. Abdomen is soft. Extremities are without edema. Gait and ROM are intact. No gross neurologic deficits noted.  LABORATORY DATA:   Lab Results  Component Value Date   WBC 6.8 08/30/2013   HGB 14.8 08/30/2013   HCT 44.5 08/30/2013   PLT 199.0 08/30/2013   GLUCOSE 97 08/30/2013   CHOL 105 10/04/2013   TRIG 169.0* 10/04/2013   HDL 28.90* 10/04/2013   LDLDIRECT 54.7 11/10/2012   LDLCALC 42 10/04/2013   ALT 18 10/04/2013   AST 18 10/04/2013   NA 141 08/30/2013   K 4.7 08/30/2013   CL 106 08/30/2013   CREATININE 1.1 08/30/2013   BUN 15 08/30/2013   CO2 29 08/30/2013   TSH 1.536 08/19/2010   INR 1.1* 08/30/2013   HGBA1C  Value: 5.3 (NOTE)                                                                       According to the ADA Clinical Practice Recommendations for 2011, when HbA1c is used as a screening test:   >=6.5%    Diagnostic of Diabetes Mellitus           (if abnormal result  is confirmed)  5.7-6.4%   Increased risk of developing Diabetes Mellitus  References:Diagnosis and Classification of Diabetes Mellitus,Diabetes Care,2011,34(Suppl 1):S62-S69 and Standards of Medical Care in         Diabetes - 2011,Diabetes Care,2011,34  (Suppl 1):S11-S61. 08/19/2010     Assessment / Plan: 1. CAD - prior MI with CABG - s/p cardiac cath Feb 2015 noted above- will continue with medical management. He is doing well clinically. Continue with CV risk factor modification.   2. HLD - cholesterol levels are excellent. Triglycerides are elevated. Needs to do better with diet and exercise. Lose weight.  3. HTN - BP controlled. Continue Rx.  I will follow up in 6 months. He is going to have complete lab work with Dr. Su Hilt in October.

## 2014-04-10 NOTE — Patient Instructions (Signed)
Work on increasing your aerobic exercise and losing weight.  Continue your medical therapy  I will see you in 6 months.

## 2014-04-20 ENCOUNTER — Other Ambulatory Visit: Payer: Self-pay | Admitting: *Deleted

## 2014-04-20 MED ORDER — ATORVASTATIN CALCIUM 20 MG PO TABS: 20.0000 mg | ORAL_TABLET | Freq: Every day | ORAL | Status: DC

## 2014-04-20 MED ORDER — LISINOPRIL 5 MG PO TABS: 5.0000 mg | ORAL_TABLET | Freq: Every morning | ORAL | Status: DC

## 2014-05-01 ENCOUNTER — Other Ambulatory Visit: Payer: Self-pay | Admitting: Internal Medicine

## 2014-05-01 DIAGNOSIS — E049 Nontoxic goiter, unspecified: Secondary | ICD-10-CM

## 2014-05-03 ENCOUNTER — Ambulatory Visit
Admission: RE | Admit: 2014-05-03 | Discharge: 2014-05-03 | Disposition: A | Discharge status: Home / Self Care | Payer: Medicare PPO | Source: Ambulatory Visit | Attending: Internal Medicine | Admitting: Internal Medicine

## 2014-05-03 DIAGNOSIS — E049 Nontoxic goiter, unspecified: Secondary | ICD-10-CM

## 2014-06-28 ENCOUNTER — Encounter (HOSPITAL_COMMUNITY): Payer: Self-pay | Admitting: Cardiology

## 2014-08-01 ENCOUNTER — Other Ambulatory Visit: Payer: Self-pay | Admitting: *Deleted

## 2014-08-01 MED ORDER — CARVEDILOL 6.25 MG PO TABS: 6.2500 mg | ORAL_TABLET | Freq: Two times a day (BID) | ORAL | Status: DC

## 2014-10-09 ENCOUNTER — Ambulatory Visit: Payer: Medicare PPO | Admitting: Cardiology

## 2014-10-17 ENCOUNTER — Other Ambulatory Visit: Payer: Self-pay

## 2014-10-17 MED ORDER — ATORVASTATIN CALCIUM 20 MG PO TABS: 20.0000 mg | ORAL_TABLET | Freq: Every day | ORAL | Status: DC

## 2014-10-17 MED ORDER — LISINOPRIL 5 MG PO TABS: 5.0000 mg | ORAL_TABLET | Freq: Every morning | ORAL | Status: DC

## 2014-10-31 ENCOUNTER — Other Ambulatory Visit: Payer: Self-pay

## 2014-10-31 MED ORDER — CARVEDILOL 6.25 MG PO TABS: 6.2500 mg | ORAL_TABLET | Freq: Two times a day (BID) | ORAL | Status: DC

## 2014-11-13 ENCOUNTER — Encounter: Payer: Self-pay | Admitting: Cardiology

## 2014-11-13 ENCOUNTER — Other Ambulatory Visit: Payer: Self-pay

## 2014-11-13 ENCOUNTER — Ambulatory Visit (INDEPENDENT_AMBULATORY_CARE_PROVIDER_SITE_OTHER): Payer: Medicare PPO | Admitting: Cardiology

## 2014-11-13 VITALS — BP 124/74 | HR 49 | Ht 70.0 in | Wt 200.2 lb

## 2014-11-13 DIAGNOSIS — E7800 Pure hypercholesterolemia, unspecified: Secondary | ICD-10-CM

## 2014-11-13 DIAGNOSIS — I25708 Atherosclerosis of coronary artery bypass graft(s), unspecified, with other forms of angina pectoris: Secondary | ICD-10-CM

## 2014-11-13 MED ORDER — ATORVASTATIN CALCIUM 20 MG PO TABS: 20.0000 mg | ORAL_TABLET | Freq: Every day | ORAL | Status: DC

## 2014-11-13 MED ORDER — LISINOPRIL 5 MG PO TABS: 5.0000 mg | ORAL_TABLET | Freq: Every morning | ORAL | Status: DC

## 2014-11-13 MED ORDER — CARVEDILOL 6.25 MG PO TABS: 6.2500 mg | ORAL_TABLET | Freq: Two times a day (BID) | ORAL | Status: DC

## 2014-11-13 NOTE — Progress Notes (Signed)
Joshua Mcpherson Date of Birth: 1946/06/10 Medical Record #[id number]  History of Present Illness: Joshua Mcpherson is seen for follow up CAD.  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 Post bypass his ejection fraction was normal at 60%. Other issues include depression, gout, LD and HTN.  In February 2015 he had an abnormal stress echo - was referred on for cardiac cath. This demonstrated patent grafts to the diagonal and LAD. The graft to the OM was occluded. This vessel was small and medical therapy recommended. Native RCA was patent. EF normal.  On follow up today he is doing very well. Rare anginal symptoms. Has not had to take Ntg. Symptoms resolve with rest. He is active playing golf and walks 4 mi/day. Has lost 6 lbs.   Current Outpatient Prescriptions  Medication Sig Dispense Refill  . allopurinol (ZYLOPRIM) 300 MG tablet Take 300 mg by mouth every morning.     Marland Kitchen aspirin EC 81 MG tablet Take 81 mg by mouth every morning.    Marland Kitchen atorvastatin (LIPITOR) 20 MG tablet Take 1 tablet (20 mg total) by mouth daily at 6 PM. 90 tablet 0  . carvedilol (COREG) 6.25 MG tablet Take 1 tablet (6.25 mg total) by mouth 2 (two) times daily with a meal. 60 tablet 0  . lisinopril (PRINIVIL,ZESTRIL) 5 MG tablet Take 1 tablet (5 mg total) by mouth every morning. 90 tablet 0  . nitroGLYCERIN (NITROSTAT) 0.4 MG SL tablet Place 1 tablet (0.4 mg total) under the tongue every 5 (five) minutes as needed for chest pain. 25 tablet 3  . VIAGRA 100 MG tablet Take 100 mg by mouth as needed for erectile dysfunction.      No current facility-administered medications for this visit.    No Known Allergies  Past Medical History  Diagnosis Date  . 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 Depression   . Fracture     MULTIPLE  . Gout   . LV dysfunction     Past Surgical History    Procedure Laterality Date  . Coronary artery bypass graft    . Cardiac catheterization    . Tonsillectomy    . Multiple fractures    . Left heart catheterization with coronary/graft angiogram N/A 09/01/2013    Procedure: LEFT HEART CATHETERIZATION WITH Isabel Caprice;  Surgeon: Joshua Mcpherson;  Location: [facility] CATH LAB;  Service: Cardiovascular;  Laterality: N/A;    History  Smoking status  . Former Smoker -- 0.50 packs/day for 15 years  . Types: Cigarettes  . Quit date: 07/20/1968  Smokeless tobacco  . Never Used    History  Alcohol Use  . 1.8 oz/week  . 3 Cans of beer per week    Comment: OCCASSIONALY    Family History  Problem Relation Age of Onset  . Heart disease Father     Review of Systems: The review of systems is per the HPI.  All other systems were reviewed and are negative.  Physical Exam: There were no vitals taken for this visit. Patient is very pleasant and in no acute distress. Skin is warm and dry. Color is normal.  HEENT is unremarkable. Normocephalic/atraumatic. PERRL. Sclera are nonicteric. Neck is supple. No masses. No JVD. Lungs are clear. Cardiac exam shows a regular rate and rhythm. Abdomen is soft. Extremities are  without edema. Gait and ROM are intact. No gross neurologic deficits noted.  LABORATORY DATA:   Lab Results  Component Value Date   WBC 6.8 08/30/2013   HGB 14.8 08/30/2013   HCT 44.5 08/30/2013   PLT 199.0 08/30/2013   GLUCOSE 97 08/30/2013   CHOL 105 10/04/2013   TRIG 169.0* 10/04/2013   HDL 28.90* 10/04/2013   LDLDIRECT 54.7 11/10/2012   LDLCALC 42 10/04/2013   ALT 18 10/04/2013   AST 18 10/04/2013   NA 141 08/30/2013   K 4.7 08/30/2013   CL 106 08/30/2013   CREATININE 1.1 08/30/2013   BUN 15 08/30/2013   CO2 29 08/30/2013   TSH 1.536 08/19/2010   INR 1.1* 08/30/2013   HGBA1C  08/19/2010    5.3 (NOTE)                                                                       According to the ADA Clinical  Practice Recommendations for 2011, when HbA1c is used as a screening test:   >=6.5%   Diagnostic of Diabetes Mellitus           (if abnormal result  is confirmed)  5.7-6.4%   Increased risk of developing Diabetes Mellitus  References:Diagnosis and Classification of Diabetes Mellitus,Diabetes Care,2011,34(Suppl 1):S62-S69 and Standards of Medical Care in         Diabetes - 2011,Diabetes Care,2011,34  (Suppl 1):S11-S61.   Ecg today shows NSR with rate 49 bpm. LAD, nonspecific ST abnormality.  Assessment / Plan: 1. CAD - prior MI with CABG - s/p cardiac cath Feb 2015 noted above- will continue with medical management. He is doing well clinically. Stable class 1-2 angina. Continue with CV risk factor modification.   2. HLD - cholesterol levels are excellent. Triglycerides are elevated. Continue with lifestyle modification. Consider adding 1-2 grams of omega 3 daily. Will request most recent labs from Joshua Mcpherson.   3. HTN - BP controlled. Continue Rx.  I will follow up in 6 months.

## 2014-11-13 NOTE — Patient Instructions (Signed)
Continue your current therapy  Consider adding 1-2 grams of omega 3 today  I will see you in 6 months.

## 2015-03-20 IMAGING — CR DG CHEST 2V
2 series · 2 of 2 positions shown · non-contrast
Comparison: October 04, 2010.

CLINICAL DATA: Coronary artery disease, myocardial infarction.

EXAM:
CHEST  2 VIEW

[view not recorded (1 of 2)]
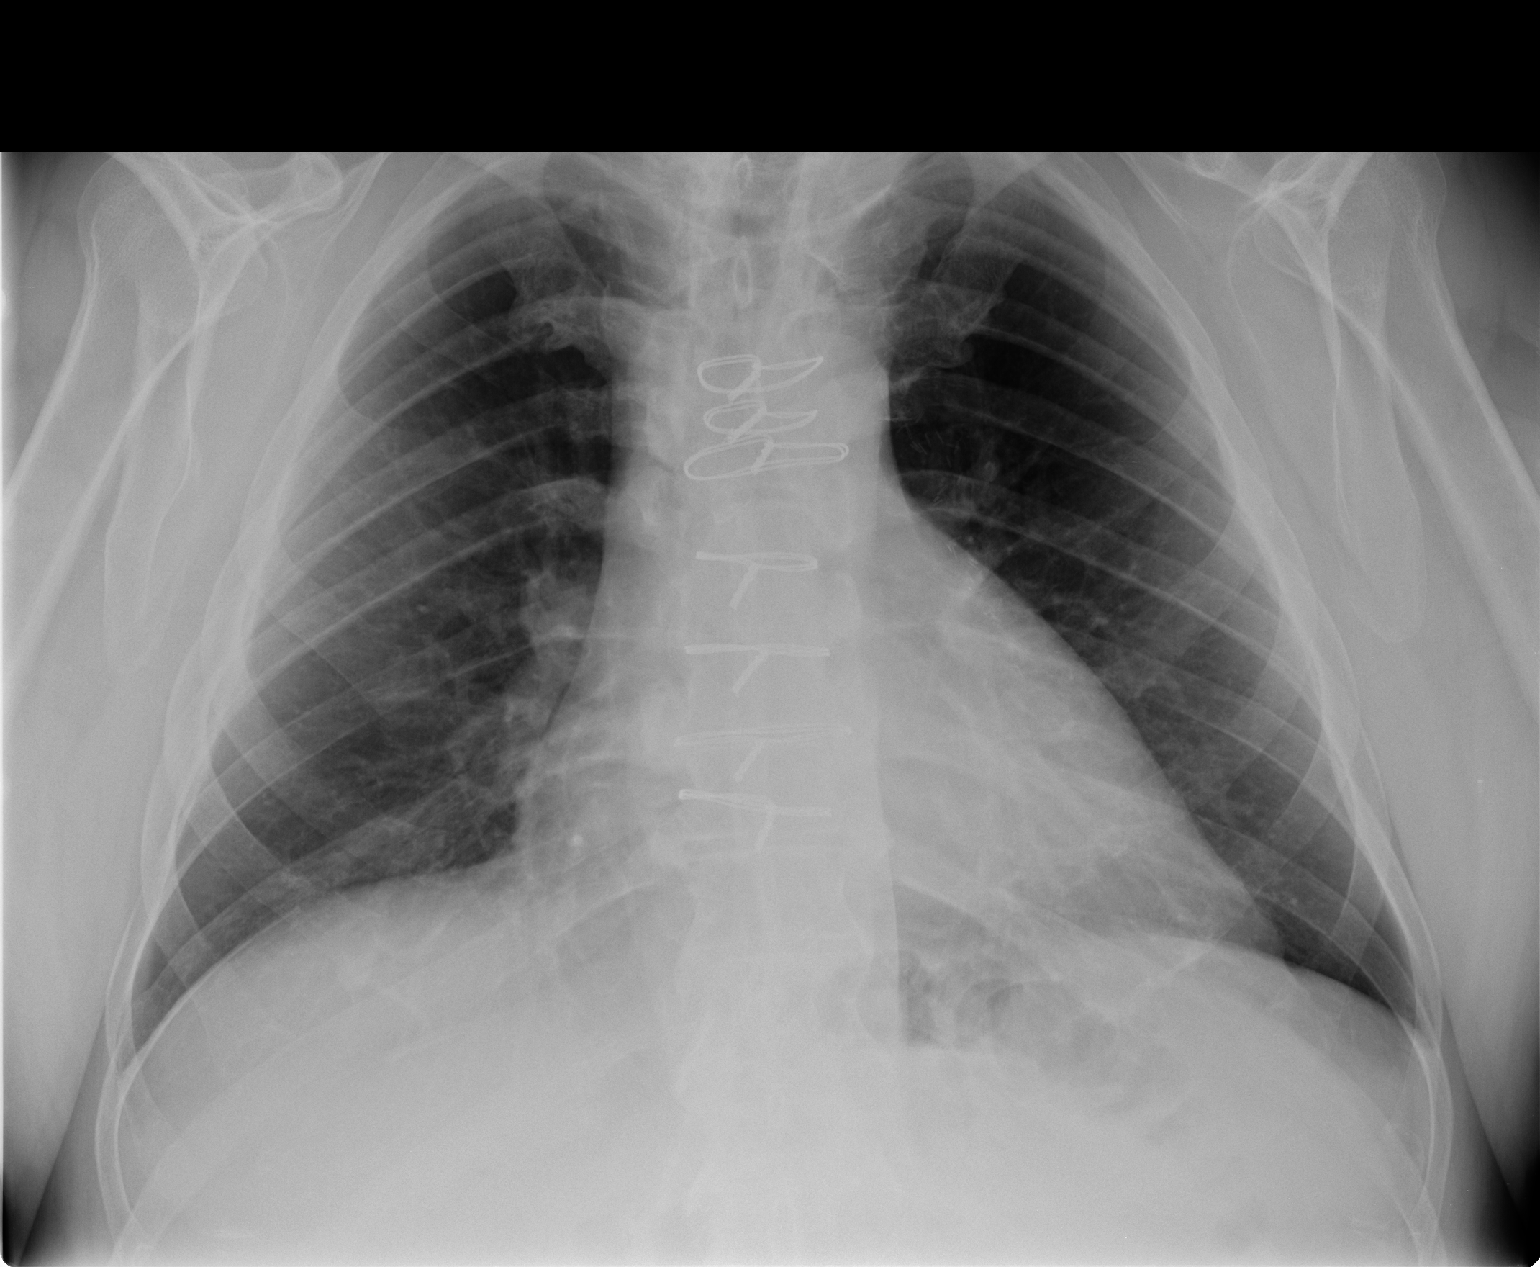

[view not recorded (2 of 2)]
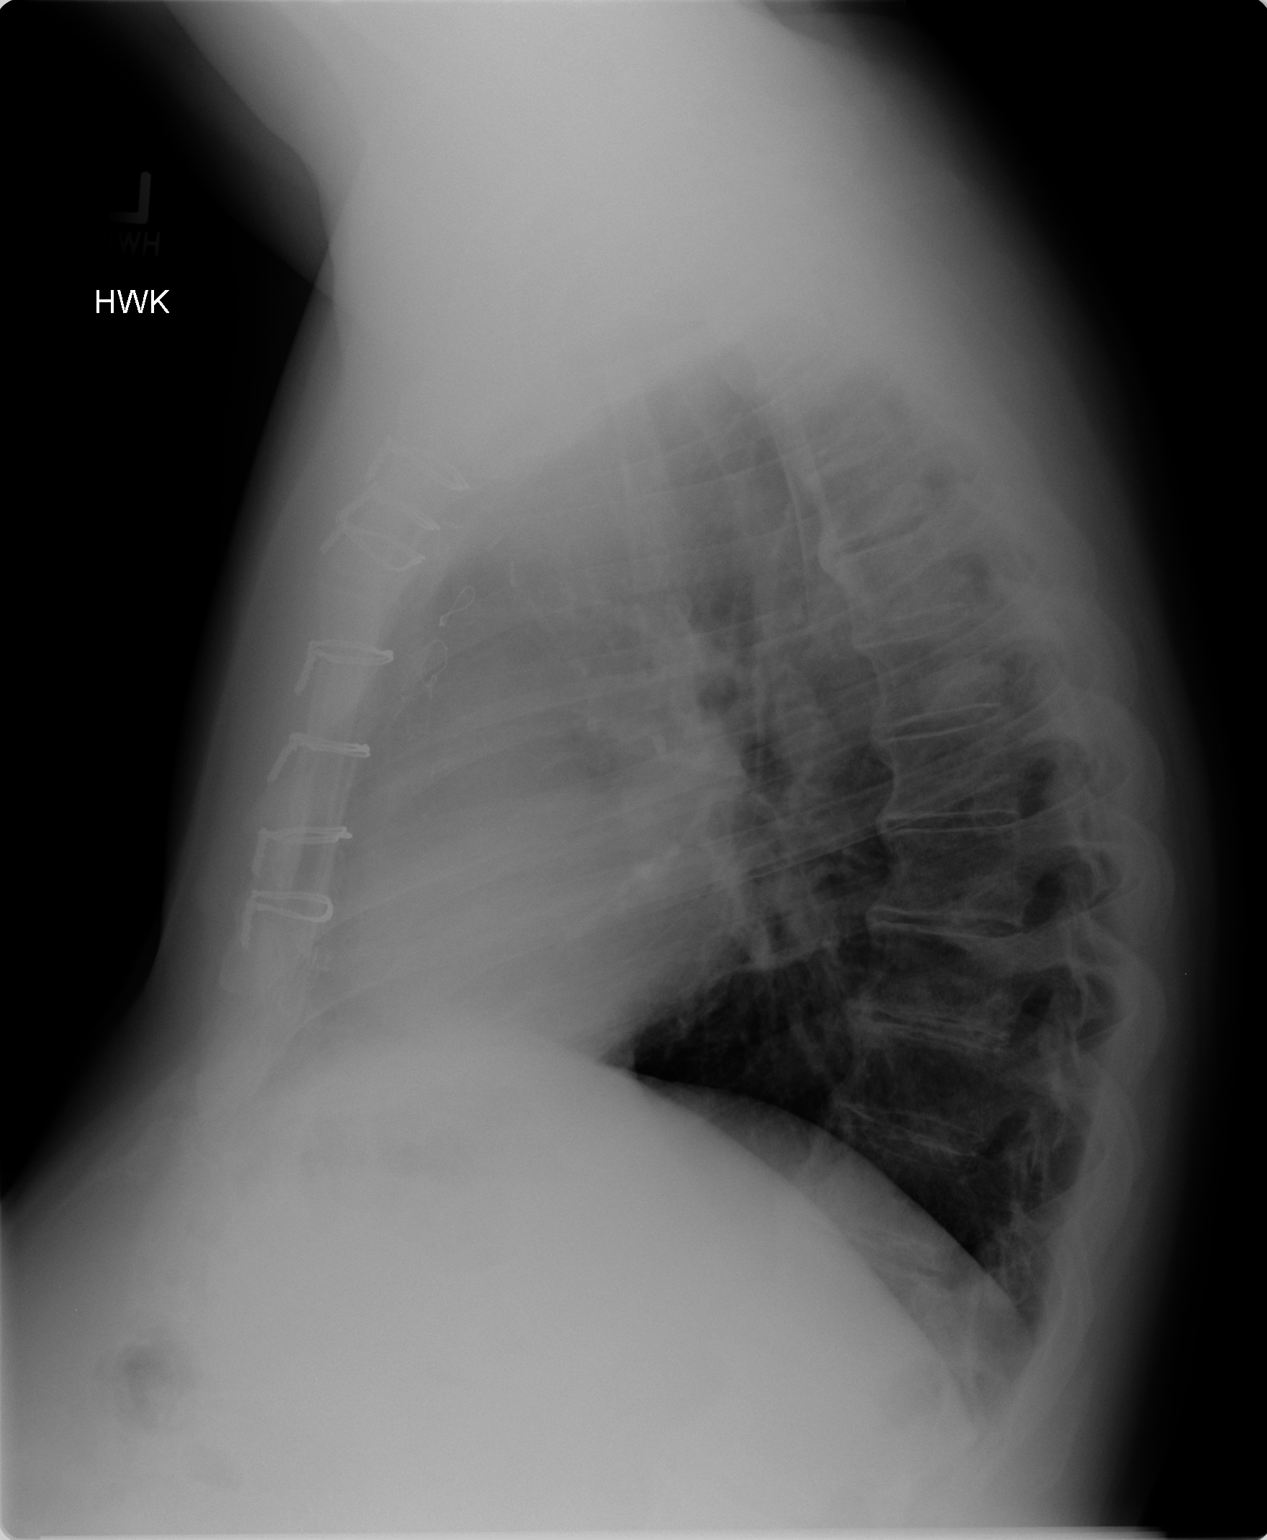

[2 of 2 positions shown; findings below may reference images not displayed]

FINDINGS: The heart size and mediastinal contours are within normal limits.
Both lungs are clear. Status post coronary artery bypass graft. No
pneumothorax or pleural effusion is noted. The visualized skeletal
structures are unremarkable.
IMPRESSION: No active cardiopulmonary disease.

## 2015-05-28 ENCOUNTER — Ambulatory Visit (INDEPENDENT_AMBULATORY_CARE_PROVIDER_SITE_OTHER): Payer: Medicare PPO | Admitting: Cardiology

## 2015-05-28 ENCOUNTER — Encounter: Payer: Self-pay | Admitting: Cardiology

## 2015-05-28 VITALS — BP 104/64 | HR 54 | Ht 70.5 in | Wt 193.1 lb

## 2015-05-28 DIAGNOSIS — I25810 Atherosclerosis of coronary artery bypass graft(s) without angina pectoris: Secondary | ICD-10-CM

## 2015-05-28 DIAGNOSIS — E7800 Pure hypercholesterolemia, unspecified: Secondary | ICD-10-CM | POA: Diagnosis not present

## 2015-05-28 MED ORDER — CARVEDILOL 6.25 MG PO TABS: 3.1250 mg | ORAL_TABLET | Freq: Two times a day (BID) | ORAL | Status: DC

## 2015-05-28 NOTE — Patient Instructions (Addendum)
Continue your current therapy except reduce Coreg to 3.125 mg twice a day.     I will see you in 6 months

## 2015-05-28 NOTE — Progress Notes (Signed)
Joshua Mcpherson Date of Birth: 1945-10-16 Medical Record #[id number]  History of Present Illness: Joshua Mcpherson is seen for follow up CAD.  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 Post bypass his ejection fraction was normal at 60%. Other issues include depression, gout, LD and HTN.  In February 2015 he had an abnormal stress echo - was referred on for cardiac cath. This demonstrated patent grafts to the diagonal and LAD. The graft to the OM was occluded. This vessel was small and medical therapy recommended. Native RCA was patent. EF normal.  On follow up today he is doing very well. No  anginal symptoms. Has not had to take Ntg.  He is active playing golf twice a week and  walks 5 miles 3 days/week. Has lost 7 lbs. He had recent lab work with Dr. Su Hilt. He has been experiencing some lightheadedness especially in hot weather. Pulse at Dr. Su Hilt visit was 48.   Current Outpatient Prescriptions  Medication Sig Dispense Refill  . allopurinol (ZYLOPRIM) 300 MG tablet Take 300 mg by mouth every morning.     Marland Kitchen aspirin EC 81 MG tablet Take 81 mg by mouth every morning.    Marland Kitchen atorvastatin (LIPITOR) 20 MG tablet Take 1 tablet (20 mg total) by mouth daily at 6 PM. 90 tablet 3  . carvedilol (COREG) 6.25 MG tablet Take 0.5 tablets (3.125 mg total) by mouth 2 (two) times daily with a meal. 180 tablet 3  . lisinopril (PRINIVIL,ZESTRIL) 5 MG tablet Take 1 tablet (5 mg total) by mouth every morning. 90 tablet 3  . nitroGLYCERIN (NITROSTAT) 0.4 MG SL tablet Place 1 tablet (0.4 mg total) under the tongue every 5 (five) minutes as needed for chest pain. 25 tablet 3  . VIAGRA 100 MG tablet Take 100 mg by mouth as needed for erectile dysfunction.      No current facility-administered medications for this visit.    No Known Allergies  Past Medical History  Diagnosis Date  .  Anterolateral myocardial infarction [facility]) 08/19/10    STEMI  . Depression   . Fracture     MULTIPLE  . Gout   . LV dysfunction   . CAD (coronary artery disease)     Past Surgical History  Procedure Laterality Date  . Coronary artery bypass graft    . Cardiac catheterization    . Tonsillectomy    . Multiple fractures    . Left heart catheterization with coronary/graft angiogram N/A 09/01/2013    Procedure: LEFT HEART CATHETERIZATION WITH Isabel Caprice;  Surgeon: Peter M Swaziland, MD;  Location: [facility] CATH LAB;  Service: Cardiovascular;  Laterality: N/A;    History  Smoking status  . Former Smoker -- 0.50 packs/day for 15 years  . Types: Cigarettes  . Quit date: 07/20/1968  Smokeless tobacco  . Never Used    History  Alcohol Use  . 1.8 oz/week  . 3 Cans of beer per week    Comment: OCCASSIONALY    Family History  Problem Relation Age of Onset  . Heart disease Father     Review of Systems: The review of systems is per the HPI.  All other systems were reviewed and are negative.  Physical Exam: BP 104/64 mmHg  Pulse 54  Ht 5' 10.5" (1.791 m)  Wt 87.573 kg (193 lb 1 oz)  BMI 27.30 kg/m2 Patient is very pleasant and in no acute distress. Skin is warm and dry. Color is normal.  HEENT is unremarkable. Normocephalic/atraumatic. PERRL. Sclera are nonicteric. Neck is supple. No masses. No JVD. Lungs are clear. Cardiac exam shows a regular rate and rhythm. Abdomen is soft. Extremities are without edema. Gait and ROM are intact. No gross neurologic deficits noted.  LABORATORY DATA:   Lab Results  Component Value Date   WBC 6.8 08/30/2013   HGB 14.8 08/30/2013   HCT 44.5 08/30/2013   PLT 199.0 08/30/2013   GLUCOSE 97 08/30/2013   CHOL 105 10/04/2013   TRIG 169.0* 10/04/2013   HDL 28.90* 10/04/2013   LDLDIRECT 54.7 11/10/2012   LDLCALC 42 10/04/2013   ALT 18 10/04/2013   AST 18 10/04/2013   NA 141 08/30/2013   K 4.7 08/30/2013   CL 106 08/30/2013    CREATININE 1.1 08/30/2013   BUN 15 08/30/2013   CO2 29 08/30/2013   TSH 1.536 08/19/2010   INR 1.1* 08/30/2013   HGBA1C  08/19/2010    5.3 (NOTE)                                                                       According to the ADA Clinical Practice Recommendations for 2011, when HbA1c is used as a screening test:   >=6.5%   Diagnostic of Diabetes Mellitus           (if abnormal result  is confirmed)  5.7-6.4%   Increased risk of developing Diabetes Mellitus  References:Diagnosis and Classification of Diabetes Mellitus,Diabetes Care,2011,34(Suppl 1):S62-S69 and Standards of Medical Care in         Diabetes - 2011,Diabetes Care,2011,34  (Suppl 1):S11-S61.     Assessment / Plan: 1. CAD - prior MI with CABG - s/p cardiac cath Feb 2015 noted above- will continue with medical management. He is doing well clinically. Stable class 1 angina. Continue with CV risk factor modification. Given lightheadedness will reduce Coreg to 3.125 mg bid. Follow up in 6 months.   2. HLD - Will request most recent labs from Dr. Su Hilt.   3. HTN - BP controlled. Continue Rx.  I will follow up in 6 months.

## 2015-11-25 ENCOUNTER — Encounter: Payer: Self-pay | Admitting: Cardiology

## 2015-11-25 ENCOUNTER — Ambulatory Visit (INDEPENDENT_AMBULATORY_CARE_PROVIDER_SITE_OTHER): Payer: Medicare Other | Admitting: Cardiology

## 2015-11-25 VITALS — BP 114/66 | HR 48 | Ht 70.5 in | Wt 194.4 lb

## 2015-11-25 DIAGNOSIS — E7800 Pure hypercholesterolemia, unspecified: Secondary | ICD-10-CM

## 2015-11-25 DIAGNOSIS — I25810 Atherosclerosis of coronary artery bypass graft(s) without angina pectoris: Secondary | ICD-10-CM | POA: Diagnosis not present

## 2015-11-25 MED ORDER — LOSARTAN POTASSIUM 25 MG PO TABS: 25.0000 mg | ORAL_TABLET | Freq: Every day | ORAL | Status: DC

## 2015-11-25 NOTE — Progress Notes (Signed)
Joshua Mcpherson Date of Birth: 15-Dec-1945 Medical Record #[id number]  History of Present Illness: Joshua Mcpherson is seen for follow up CAD.  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 Post bypass his ejection fraction was normal at 60%. Other issues include depression, gout, HLD and HTN.  In February 2015 he had an abnormal stress echo - was referred on for cardiac cath. This demonstrated patent grafts to the diagonal and LAD. The graft to the OM was occluded. This vessel was small and medical therapy recommended. Native RCA was patent. EF normal.  On follow up today he is doing very well. No  anginal symptoms. Has not had to take Ntg.  He is active playing golf twice a week and  walks 3-4 miles/day.  We previously reduced Coreg due to brady cardia. He does note a dry cough related to lisinopril.   Current Outpatient Prescriptions  Medication Sig Dispense Refill  . allopurinol (ZYLOPRIM) 300 MG tablet Take 300 mg by mouth every morning.     Marland Kitchen aspirin EC 81 MG tablet Take 81 mg by mouth every morning.    Marland Kitchen atorvastatin (LIPITOR) 20 MG tablet Take 1 tablet (20 mg total) by mouth daily at 6 PM. 90 tablet 3  . carvedilol (COREG) 6.25 MG tablet Take 0.5 tablets (3.125 mg total) by mouth 2 (two) times daily with a meal. 180 tablet 3  . lisinopril (PRINIVIL,ZESTRIL) 5 MG tablet Take 1 tablet (5 mg total) by mouth every morning. 90 tablet 3  . nitroGLYCERIN (NITROSTAT) 0.4 MG SL tablet Place 1 tablet (0.4 mg total) under the tongue every 5 (five) minutes as needed for chest pain. 25 tablet 3  . VIAGRA 100 MG tablet Take 100 mg by mouth as needed for erectile dysfunction.      No current facility-administered medications for this visit.    No Known Allergies  Past Medical History  Diagnosis Date  . Anterolateral myocardial infarction [facility]) 08/19/10    STEMI  . Depression   .  Fracture     MULTIPLE  . Gout   . LV dysfunction   . CAD (coronary artery disease)     Past Surgical History  Procedure Laterality Date  . Coronary artery bypass graft    . Cardiac catheterization    . Tonsillectomy    . Multiple fractures    . Left heart catheterization with coronary/graft angiogram N/A 09/01/2013    Procedure: LEFT HEART CATHETERIZATION WITH Isabel Caprice;  Surgeon: Demarian Epps M Swazil[place], MD;  Location: [facility] CATH LAB;  Service: Cardiovascular;  Laterality: N/A;    History  Smoking status  . Former Smoker -- 0.50 packs/day for 15 years  . Types: Cigarettes  . Quit date: 07/20/1968  Smokeless tobacco  . Never Used    History  Alcohol Use  . 1.8 oz/week  . 3 Cans of beer per week    Comment: OCCASSIONALY    Family History  Problem Relation Age of Onset  . Heart disease Father     Review of Systems: The review of systems is per the HPI.  All other systems were reviewed and are negative.  Physical Exam: There were no vitals taken for this visit. Patient is very pleasant and in no acute distress. Skin is warm and dry. Color is normal.  HEENT is unremarkable. Normocephalic/atraumatic. PERRL. Sclera are nonicteric.  Neck is supple. No masses. No JVD. Lungs are clear. Cardiac exam shows a regular rate and rhythm. Abdomen is soft. Extremities are without edema. Gait and ROM are intact. No gross neurologic deficits noted.  LABORATORY DATA:   Lab Results  Component Value Date   WBC 6.8 08/30/2013   HGB 14.8 08/30/2013   HCT 44.5 08/30/2013   PLT 199.0 08/30/2013   GLUCOSE 97 08/30/2013   CHOL 105 10/04/2013   TRIG 169.0* 10/04/2013   HDL 28.90* 10/04/2013   LDLDIRECT 54.7 11/10/2012   LDLCALC 42 10/04/2013   ALT 18 10/04/2013   AST 18 10/04/2013   NA 141 08/30/2013   K 4.7 08/30/2013   CL 106 08/30/2013   CREATININE 1.1 08/30/2013   BUN 15 08/30/2013   CO2 29 08/30/2013   TSH 1.536 08/19/2010   INR 1.1* 08/30/2013   HGBA1C  08/19/2010     5.3 (NOTE)                                                                       According to the ADA Clinical Practice Recommendations for 2011, when HbA1c is used as a screening test:   >=6.5%   Diagnostic of Diabetes Mellitus           (if abnormal result  is confirmed)  5.7-6.4%   Increased risk of developing Diabetes Mellitus  References:Diagnosis and Classification of Diabetes Mellitus,Diabetes Care,2011,34(Suppl 1):S62-S69 and Standards of Medical Care in         Diabetes - 2011,Diabetes Care,2011,34  (Suppl 1):S11-S61.   Labs reviewed from Dr. Su Hilt in October 2016. Glucose 105, A1c 5.8%. Other chemistries, CBC and TFTs normal. Cholesterol 124, triglycerides 167, HDL 33, LDL 58.   Ecg today shows sinus brady with HR 48. Low voltage. I have personally reviewed and interpreted this study.   Assessment / Plan: 1. CAD - prior MI with CABG - s/p cardiac cath Feb 2015 noted above- will continue with medical management. He is doing well clinically. Stable class 1 angina. Continue with CV risk factor modification. Given persistent bradycardia will stop Coreg.  Follow up in 6 months.   2. HLD - Well controlled on statin  3. HTN - BP controlled. Will stop lisinopril due to cough and start losartan 25 mg daily.   I will follow up in 6 months.

## 2015-11-25 NOTE — Patient Instructions (Signed)
Stop lisinopril and Carvedilol  Start losartan 25 mg daily  Continue your other therapy  I will see you in 6 months.

## 2016-01-08 ENCOUNTER — Other Ambulatory Visit: Payer: Self-pay | Admitting: Cardiology

## 2016-11-12 ENCOUNTER — Other Ambulatory Visit: Payer: Self-pay | Admitting: Cardiology

## 2016-12-13 ENCOUNTER — Other Ambulatory Visit: Payer: Self-pay | Admitting: Cardiology

## 2016-12-29 ENCOUNTER — Telehealth: Payer: Self-pay | Admitting: Cardiology

## 2017-01-04 NOTE — Progress Notes (Signed)
Joshua Mcpherson Date of Birth: 07/02/1946 Medical Record #[id number]  History of Present Illness: Joshua Mcpherson is seen for follow up CAD.  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 Post bypass his ejection fraction was normal at 60%. Other issues include depression, gout, HLD and HTN.  In February 2015 he had an abnormal stress echo - was referred on for cardiac cath. This demonstrated patent grafts to the diagonal and LAD. The graft to the OM was occluded. This vessel was small and medical therapy recommended. Native RCA was patent. EF normal.  On his last visit we stopped his beta blocker due to bradycardia. ACEi was switched to losartan due to persistent cough. His cough did resolve.   He stays active playing golf 3x/week and walking 3x/week. No chest pain or dyspnea. Feels energy is good. No complaints.   Current Outpatient Prescriptions  Medication Sig Dispense Refill  . allopurinol (ZYLOPRIM) 300 MG tablet Take 300 mg by mouth every morning.     Marland Kitchen aspirin EC 81 MG tablet Take 81 mg by mouth every morning.    Marland Kitchen atorvastatin (LIPITOR) 20 MG tablet TAKE 1 TABLET (20 MG TOTAL) BY MOUTH DAILY AT 6 PM. 90 tablet 3  . losartan (COZAAR) 25 MG tablet TAKE ONE TABLET (25 MG TOTAL) BY MOUTH DAILY. 15 tablet 0  . nitroGLYCERIN (NITROSTAT) 0.4 MG SL tablet Place 1 tablet (0.4 mg total) under the tongue every 5 (five) minutes as needed for chest pain. 25 tablet 3  . VIAGRA 100 MG tablet Take 100 mg by mouth as needed for erectile dysfunction.      No current facility-administered medications for this visit.     Allergies  Allergen Reactions  . Lisinopril Cough    Past Medical History:  Diagnosis Date  . Anterolateral myocardial infarction [facility]) 08/19/10   STEMI  . CAD (coronary artery disease)   . Depression   . Fracture    MULTIPLE  . Gout   . LV dysfunction      Past Surgical History:  Procedure Laterality Date  . CARDIAC CATHETERIZATION    . CORONARY ARTERY BYPASS GRAFT    . LEFT HEART CATHETERIZATION WITH CORONARY/GRAFT ANGIOGRAM N/A 09/01/2013   Procedure: LEFT HEART CATHETERIZATION WITH Isabel Caprice;  Surgeon: Peter M Swazil[place], MD;  Location: [facility] CATH LAB;  Service: Cardiovascular;  Laterality: N/A;  . Multiple fractures    . TONSILLECTOMY      History  Smoking Status  . Former Smoker  . Packs/day: 0.50  . Years: 15.00  . Types: Cigarettes  . Quit date: 07/20/1968  Smokeless Tobacco  . Never Used    History  Alcohol Use  . 1.8 oz/week  . 3 Cans of beer per week    Comment: OCCASSIONALY    Family History  Problem Relation Age of Onset  . Heart disease Father     Review of Systems: The review of systems is per the HPI.  All other systems were reviewed and are negative.  Physical Exam: BP 138/70   Pulse (!) 52   Ht 5' 10.5" (1.791 m)   Wt 195 lb (88.5 kg)   BMI 27.58 kg/m  Patient is very pleasant and in no acute distress. Skin is warm and dry. Color is normal.  HEENT is unremarkable. Normocephalic/atraumatic. PERRL. Sclera are nonicteric. Neck is supple. No  masses. No JVD. Lungs are clear. Cardiac exam shows a regular rate and rhythm. Abdomen is soft. Extremities are without edema. Gait and ROM are intact. No gross neurologic deficits noted.  LABORATORY DATA:   Lab Results  Component Value Date   WBC 6.8 08/30/2013   HGB 14.8 08/30/2013   HCT 44.5 08/30/2013   PLT 199.0 08/30/2013   GLUCOSE 97 08/30/2013   CHOL 105 10/04/2013   TRIG 169.0 (H) 10/04/2013   HDL 28.90 (L) 10/04/2013   LDLDIRECT 54.7 11/10/2012   LDLCALC 42 10/04/2013   ALT 18 10/04/2013   AST 18 10/04/2013   NA 141 08/30/2013   K 4.7 08/30/2013   CL 106 08/30/2013   CREATININE 1.1 08/30/2013   BUN 15 08/30/2013   CO2 29 08/30/2013   TSH 1.536 08/19/2010   INR 1.1 (H) 08/30/2013   HGBA1C  08/19/2010    5.3 (NOTE)                                                                        According to the ADA Clinical Practice Recommendations for 2011, when HbA1c is used as a screening test:   >=6.5%   Diagnostic of Diabetes Mellitus           (if abnormal result  is confirmed)  5.7-6.4%   Increased risk of developing Diabetes Mellitus  References:Diagnosis and Classification of Diabetes Mellitus,Diabetes Care,2011,34(Suppl 1):S62-S69 and Standards of Medical Care in         Diabetes - 2011,Diabetes Care,2011,34  (Suppl 1):S11-S61.   Labs reviewed from Dr. Su Hilt in October 2016. Glucose 105, A1c 5.8%. Other chemistries, CBC and TFTs normal. Cholesterol 124, triglycerides 167, HDL 33, LDL 58.   Ecg today shows sinus brady with HR 52. LAD. No acute changes. I have personally reviewed and interpreted this study.   Assessment / Plan: 1. CAD - prior MI with CABG - s/p cardiac cath Feb 2015 noted above- will continue with medical management. Stable class 1 angina. Continue with CV risk factor modification. On ASA and statin therapy. Follow up in 6 months.   2. HLD - Will request last lab work from Dr. Su Hilt. On statin  3. HTN - BP controlled. Continue losartan.  I will follow up in 6 months.

## 2017-01-07 ENCOUNTER — Encounter: Payer: Self-pay | Admitting: Cardiology

## 2017-01-07 ENCOUNTER — Encounter (INDEPENDENT_AMBULATORY_CARE_PROVIDER_SITE_OTHER): Payer: Self-pay

## 2017-01-07 ENCOUNTER — Ambulatory Visit (INDEPENDENT_AMBULATORY_CARE_PROVIDER_SITE_OTHER): Payer: Medicare Other | Admitting: Cardiology

## 2017-01-07 VITALS — BP 138/70 | HR 52 | Ht 70.5 in | Wt 195.0 lb

## 2017-01-07 DIAGNOSIS — E7800 Pure hypercholesterolemia, unspecified: Secondary | ICD-10-CM

## 2017-01-07 DIAGNOSIS — I25810 Atherosclerosis of coronary artery bypass graft(s) without angina pectoris: Secondary | ICD-10-CM | POA: Diagnosis not present

## 2017-01-07 NOTE — Patient Instructions (Signed)
Continue your current therapy  I will see you in 6 months.   

## 2017-01-11 ENCOUNTER — Other Ambulatory Visit: Payer: Self-pay | Admitting: Cardiology

## 2017-01-19 ENCOUNTER — Other Ambulatory Visit: Payer: Self-pay | Admitting: Cardiology

## 2017-01-27 ENCOUNTER — Other Ambulatory Visit: Payer: Self-pay

## 2017-01-27 MED ORDER — LOSARTAN POTASSIUM 25 MG PO TABS: 25.0000 mg | ORAL_TABLET | Freq: Every day | ORAL | 5 refills | Status: DC

## 2017-06-29 ENCOUNTER — Ambulatory Visit: Payer: Medicare Other | Admitting: Cardiology

## 2017-08-24 NOTE — Progress Notes (Deleted)
Joshua Mcpherson Date of Birth: [birth year] Medical Record #[id number]  History of Present Illness: Joshua Mcpherson is seen for follow up CAD.  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 Post bypass his ejection fraction was normal at 60%. Other issues include depression, gout, HLD and HTN.  In February 2015 he had an abnormal stress echo - was referred on for cardiac cath. This demonstrated patent grafts to the diagonal and LAD. The graft to the OM was occluded. This vessel was small and medical therapy recommended. Native RCA was patent. EF normal.  On his last visit we stopped his beta blocker due to bradycardia. ACEi was switched to losartan due to persistent cough. His cough did resolve.   He stays active playing golf 3x/week and walking 3x/week. No chest pain or dyspnea. Feels energy is good. No complaints.   Current Outpatient Medications  Medication Sig Dispense Refill  . allopurinol (ZYLOPRIM) 300 MG tablet Take 300 mg by mouth every morning.     Marland Kitchen aspirin EC 81 MG tablet Take 81 mg by mouth every morning.    Marland Kitchen atorvastatin (LIPITOR) 20 MG tablet TAKE 1 TABLET (20 MG TOTAL) BY MOUTH DAILY AT 6 PM. 90 tablet 3  . losartan (COZAAR) 25 MG tablet Take 1 tablet (25 mg total) by mouth daily. 30 tablet 5  . nitroGLYCERIN (NITROSTAT) 0.4 MG SL tablet Place 1 tablet (0.4 mg total) under the tongue every 5 (five) minutes as needed for chest pain. 25 tablet 3  . VIAGRA 100 MG tablet Take 100 mg by mouth as needed for erectile dysfunction.      No current facility-administered medications for this visit.     Allergies  Allergen Reactions  . Lisinopril Cough    Past Medical History:  Diagnosis Date  . Anterolateral myocardial infarction [facility]) 08/19/10   STEMI  . CAD (coronary artery disease)   . Depression   . Fracture    MULTIPLE  . Gout   . LV dysfunction      Past Surgical History:  Procedure Laterality Date  . CARDIAC CATHETERIZATION    . CORONARY ARTERY BYPASS GRAFT    . LEFT HEART CATHETERIZATION WITH CORONARY/GRAFT ANGIOGRAM N/A 09/01/2013   Procedure: LEFT HEART CATHETERIZATION WITH Isabel Caprice;  Surgeon: Peter M Swazil[place], MD;  Location: [facility] CATH LAB;  Service: Cardiovascular;  Laterality: N/A;  . Multiple fractures    . TONSILLECTOMY      Social History   Tobacco Use  Smoking Status Former Smoker  . Packs/day: 0.50  . Years: 15.00  . Pack years: 7.50  . Types: Cigarettes  . Last attempt to quit: 07/20/1968  . Years since quitting: 49.1  Smokeless Tobacco Never Used    Social History   Substance and Sexual Activity  Alcohol Use Yes  . Alcohol/week: 1.8 oz  . Types: 3 Cans of beer per week   Comment: OCCASSIONALY    Family History  Problem Relation Age of Onset  . Heart disease Father     Review of Systems: The review of systems is per the HPI.  All other systems were reviewed and are negative.  Physical Exam: There were no vitals taken for this visit. Patient is very pleasant and in no acute distress. Skin is warm and dry. Color is normal.  HEENT is unremarkable. Normocephalic/atraumatic. PERRL. Sclera are nonicteric.  Neck is supple. No masses. No JVD. Lungs are clear. Cardiac exam shows a regular rate and rhythm. Abdomen is soft. Extremities are without edema. Gait and ROM are intact. No gross neurologic deficits noted.  LABORATORY DATA:   Lab Results  Component Value Date   WBC 6.8 08/30/2013   HGB 14.8 08/30/2013   HCT 44.5 08/30/2013   PLT 199.0 08/30/2013   GLUCOSE 97 08/30/2013   CHOL 105 10/04/2013   TRIG 169.0 (H) 10/04/2013   HDL 28.90 (L) 10/04/2013   LDLDIRECT 54.7 11/10/2012   LDLCALC 42 10/04/2013   ALT 18 10/04/2013   AST 18 10/04/2013   NA 141 08/30/2013   K 4.7 08/30/2013   CL 106 08/30/2013   CREATININE 1.1 08/30/2013   BUN 15 08/30/2013   CO2 29 08/30/2013   TSH 1.536  08/19/2010   INR 1.1 (H) 08/30/2013   HGBA1C  08/19/2010    5.3 (NOTE)                                                                       According to the ADA Clinical Practice Recommendations for 2011, when HbA1c is used as a screening test:   >=6.5%   Diagnostic of Diabetes Mellitus           (if abnormal result  is confirmed)  5.7-6.4%   Increased risk of developing Diabetes Mellitus  References:Diagnosis and Classification of Diabetes Mellitus,Diabetes Care,2011,34(Suppl 1):S62-S69 and Standards of Medical Care in         Diabetes - 2011,Diabetes Care,2011,34  (Suppl 1):S11-S61.   Labs reviewed from Dr. Su Hilt in October 2016. Glucose 105, A1c 5.8%. Other chemistries, CBC and TFTs normal. Cholesterol 124, triglycerides 167, HDL 33, LDL 58.   Ecg today shows sinus brady with HR 52. LAD. No acute changes. I have personally reviewed and interpreted this study.   Assessment / Plan: 1. CAD - prior MI with CABG - s/p cardiac cath Feb 2015 noted above- will continue with medical management. Stable class 1 angina. Continue with CV risk factor modification. On ASA and statin therapy. Follow up in 6 months.   2. HLD - Will request last lab work from Dr. Su Hilt. On statin  3. HTN - BP controlled. Continue losartan.  I will follow up in 6 months.

## 2017-08-26 ENCOUNTER — Ambulatory Visit: Payer: Medicare Other | Admitting: Cardiology

## 2017-09-15 ENCOUNTER — Ambulatory Visit: Payer: Medicare Other | Admitting: Cardiology

## 2017-09-30 ENCOUNTER — Other Ambulatory Visit: Payer: Self-pay | Admitting: Cardiology

## 2017-11-08 NOTE — Telephone Encounter (Signed)
error 

## 2017-11-20 NOTE — Progress Notes (Signed)
Joshua Mcpherson Date of Birth: 1945-10-21 Medical Record #[id number]  History of Present Illness: Joshua Mcpherson is seen for follow up CAD.  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 Post bypass his ejection fraction was normal at 60%. Other issues include depression, gout, HLD and HTN.  In February 2015 he had an abnormal stress echo - was referred on for cardiac cath. This demonstrated patent grafts to the diagonal and LAD. The graft to the OM was occluded. This vessel was small and medical therapy recommended. Native RCA was patent. EF normal.  On a prior visit we stopped his beta blocker due to bradycardia. ACEi was switched to losartan due to persistent cough. His cough did resolve.   On follow up today he is doing very well. Denies any chest pain, dyspnea, palpitations. Enjoys playing golf. He will have his 50th wedding anniversary this year.   Current Outpatient Medications  Medication Sig Dispense Refill  . allopurinol (ZYLOPRIM) 300 MG tablet Take 300 mg by mouth every morning.     Marland Kitchen aspirin EC 81 MG tablet Take 81 mg by mouth every morning.    Marland Kitchen atorvastatin (LIPITOR) 20 MG tablet TAKE 1 TABLET (20 MG TOTAL) BY MOUTH DAILY AT 6 PM. 90 tablet 3  . losartan (COZAAR) 25 MG tablet Take 1 tablet (25 mg total) by mouth daily. 30 tablet 5  . nitroGLYCERIN (NITROSTAT) 0.4 MG SL tablet Place 1 tablet (0.4 mg total) under the tongue every 5 (five) minutes as needed for chest pain. 25 tablet 3   No current facility-administered medications for this visit.     Allergies  Allergen Reactions  . Lisinopril Cough    Past Medical History:  Diagnosis Date  . Anterolateral myocardial infarction [facility]) 08/19/10   STEMI  . CAD (coronary artery disease)   . Depression   . Fracture    MULTIPLE  . Gout   . LV dysfunction     Past Surgical History:  Procedure Laterality  Date  . CARDIAC CATHETERIZATION    . CORONARY ARTERY BYPASS GRAFT    . LEFT HEART CATHETERIZATION WITH CORONARY/GRAFT ANGIOGRAM N/A 09/01/2013   Procedure: LEFT HEART CATHETERIZATION WITH Joshua Mcpherson;  Surgeon: Joshua M Swaziland, MD;  Location: [facility] CATH LAB;  Service: Cardiovascular;  Laterality: N/A;  . Multiple fractures    . TONSILLECTOMY      Social History   Tobacco Use  Smoking Status Former Smoker  . Packs/day: 0.50  . Years: 15.00  . Pack years: 7.50  . Types: Cigarettes  . Last attempt to quit: 07/20/1968  . Years since quitting: 49.3  Smokeless Tobacco Never Used    Social History   Substance and Sexual Activity  Alcohol Use Yes  . Alcohol/week: 1.8 oz  . Types: 3 Cans of beer per week   Comment: OCCASSIONALY    Family History  Problem Relation Age of Onset  . Heart disease Father     Review of Systems: The review of systems is per the HPI.  All other systems were reviewed and are negative.  Physical Exam: BP 130/66 (BP Location: Left Arm, Patient Position: Sitting, Cuff Size: Normal)   Pulse 69   Ht 5' 10.5" (1.791 m)   Wt 190 lb (86.2 kg)   BMI 26.88 kg/m  GENERAL:  Well appearing HEENT:  PERRL, EOMI, sclera are  clear. Oropharynx is clear. NECK:  No jugular venous distention, carotid upstroke brisk and symmetric, no bruits, no thyromegaly or adenopathy LUNGS:  Clear to auscultation bilaterally CHEST:  Unremarkable HEART:  RRR,  PMI not displaced or sustained,S1 and S2 within normal limits, no S3, no S4: no clicks, no rubs, no murmurs ABD:  Soft, nontender. BS +, no masses or bruits. No hepatomegaly, no splenomegaly EXT:  2 + pulses throughout, no edema, no cyanosis no clubbing SKIN:  Warm and dry.  No rashes NEURO:  Alert and oriented x 3. Cranial nerves II through XII intact. PSYCH:  Cognitively intact    LABORATORY DATA:   Lab Results  Component Value Date   WBC 6.8 08/30/2013   HGB 14.8 08/30/2013   HCT 44.5 08/30/2013   PLT  199.0 08/30/2013   GLUCOSE 97 08/30/2013   CHOL 105 10/04/2013   TRIG 169.0 (H) 10/04/2013   HDL 28.90 (L) 10/04/2013   LDLDIRECT 54.7 11/10/2012   LDLCALC 42 10/04/2013   ALT 18 10/04/2013   AST 18 10/04/2013   NA 141 08/30/2013   K 4.7 08/30/2013   CL 106 08/30/2013   CREATININE 1.1 08/30/2013   BUN 15 08/30/2013   CO2 29 08/30/2013   TSH 1.536 08/19/2010   INR 1.1 (H) 08/30/2013   HGBA1C  08/19/2010    5.3 (NOTE)                                                                       According to the ADA Clinical Practice Recommendations for 2011, when HbA1c is used as a screening test:   >=6.5%   Diagnostic of Diabetes Mellitus           (if abnormal result  is confirmed)  5.7-6.4%   Increased risk of developing Diabetes Mellitus  References:Diagnosis and Classification of Diabetes Mellitus,Diabetes Care,2011,34(Suppl 1):S62-S69 and Standards of Medical Care in         Diabetes - 2011,Diabetes Care,2011,34  (Suppl 1):S11-S61.   Labs reviewed from Joshua Mcpherson in October 2016. Glucose 105, A1c 5.8%. Other chemistries, CBC and TFTs normal. Cholesterol 124, triglycerides 167, HDL 33, LDL 58.   Ecg today shows sinus brady with HR 60. LAD. No acute changes. I have personally reviewed and interpreted this study.   Assessment / Plan: 1. CAD - prior MI with CABG - s/p cardiac cath Feb 2015 noted above- will continue with medical management. Stable class 1 angina. Continue with CV risk factor modification. On ASA and statin therapy. Follow up in one year  2. HLD - Will request last lab work from Joshua Mcpherson. On statin  3. HTN - BP controlled. Continue losartan.  I will follow up in one year

## 2017-11-23 ENCOUNTER — Ambulatory Visit: Payer: Medicare Other | Admitting: Cardiology

## 2017-11-23 ENCOUNTER — Encounter: Payer: Self-pay | Admitting: Cardiology

## 2017-11-23 VITALS — BP 130/66 | HR 69 | Ht 70.5 in | Wt 190.0 lb

## 2017-11-23 DIAGNOSIS — E7800 Pure hypercholesterolemia, unspecified: Secondary | ICD-10-CM

## 2017-11-23 DIAGNOSIS — I25810 Atherosclerosis of coronary artery bypass graft(s) without angina pectoris: Secondary | ICD-10-CM

## 2017-11-23 NOTE — Patient Instructions (Signed)
Continue your current therapy  I will see you in one year   

## 2018-01-21 ENCOUNTER — Other Ambulatory Visit: Payer: Self-pay | Admitting: Cardiology

## 2018-03-10 ENCOUNTER — Telehealth: Payer: Self-pay | Admitting: Cardiology

## 2018-03-10 MED ORDER — NITROGLYCERIN 0.4 MG SL SUBL: 0.4000 mg | SUBLINGUAL_TABLET | SUBLINGUAL | 3 refills | Status: DC | PRN

## 2018-03-10 NOTE — Telephone Encounter (Signed)
rx sent to pharmacy

## 2018-03-10 NOTE — Telephone Encounter (Signed)
New message    Patients spouse calling  To request refill, medication out dated  1. Which medications need to be refilled? (please list name of each medication and dose if known) nitroGLYCERIN (NITROSTAT) 0.4 MG SL tablet 2. Which pharmacy/location (including street and city if local pharmacy) is medication to be sent to?[id number] [place], [place] - [address]  3. Do they need a 30 day or 90 day supply? 30

## 2018-08-09 ENCOUNTER — Other Ambulatory Visit: Payer: Self-pay | Admitting: Cardiology

## 2018-08-09 NOTE — Telephone Encounter (Signed)
Rx request sent to pharmacy.  

## 2019-01-15 NOTE — Progress Notes (Signed)
Joshua Mcpherson Date of Birth: 1946-03-29 Medical Record #[id number]  History of Present Illness: Mr. Mussey is seen for follow up CAD.  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 Post bypass his ejection fraction was normal at 60%. Other issues include depression, gout, HLD and HTN.  In February 2015 he had an abnormal stress echo - was referred on for cardiac cath. This demonstrated patent grafts to the diagonal and LAD. The graft to the OM was occluded. This vessel was small and medical therapy recommended. Native RCA was patent. EF normal.  On a prior visit we stopped his beta blocker due to bradycardia. ACEi was switched to losartan due to persistent cough. His cough did resolve.   On follow up today he is doing very well. Denies any chest pain, dyspnea, palpitations. He does play golf. He does complain of intermittent nightmares.   Current Outpatient Medications  Medication Sig Dispense Refill  . allopurinol (ZYLOPRIM) 300 MG tablet Take 300 mg by mouth every morning.     Marland Kitchen aspirin EC 81 MG tablet Take 81 mg by mouth every morning.    Marland Kitchen atorvastatin (LIPITOR) 20 MG tablet TAKE 1 TABLET (20 MG TOTAL) BY MOUTH DAILY AT 6 PM. 90 tablet 3  . losartan (COZAAR) 25 MG tablet TAKE 1 TABLET BY MOUTH EVERY DAY 90 tablet 4  . nitroGLYCERIN (NITROSTAT) 0.4 MG SL tablet Place 1 tablet (0.4 mg total) under the tongue every 5 (five) minutes as needed for chest pain. 25 tablet 3  . sildenafil (VIAGRA) 100 MG tablet Take 1 tablet (100 mg total) by mouth daily as needed for erectile dysfunction. 10 tablet 3   No current facility-administered medications for this visit.     Allergies  Allergen Reactions  . Lisinopril Cough    Past Medical History:  Diagnosis Date  . Anterolateral myocardial infarction [facility]) 08/19/10   STEMI  . CAD (coronary artery disease)   . Depression    . Fracture    MULTIPLE  . Gout   . LV dysfunction     Past Surgical History:  Procedure Laterality Date  . CARDIAC CATHETERIZATION    . CORONARY ARTERY BYPASS GRAFT    . LEFT HEART CATHETERIZATION WITH CORONARY/GRAFT ANGIOGRAM N/A 09/01/2013   Procedure: LEFT HEART CATHETERIZATION WITH Isabel Caprice;  Surgeon: Jaquia Benedicto M [place], MD;  Location: [facility] CATH LAB;  Service: Cardiovascular;  Laterality: N/A;  . Multiple fractures    . TONSILLECTOMY      Social History   Tobacco Use  Smoking Status Former Smoker  . Packs/day: 0.50  . Years: 15.00  . Pack years: 7.50  . Types: Cigarettes  . Quit date: 07/20/1968  . Years since quitting: 50.5  Smokeless Tobacco Never Used    Social History   Substance and Sexual Activity  Alcohol Use Yes  . Alcohol/week: 3.0 standard drinks  . Types: 3 Cans of beer per week   Comment: OCCASSIONALY    Family History  Problem Relation Age of Onset  . Heart disease Father     Review of Systems: The review of systems is per the HPI.  All other systems were reviewed and are negative.  Physical Exam: BP 134/64   Pulse (!) 56   Temp (!) 97.2 F (36.2 C)   Ht 5' 10.5" (1.791 m)   Wt 189 lb  6.4 oz (85.9 kg)   SpO2 98%   BMI 26.79 kg/m  GENERAL:  Well appearing HEENT:  PERRL, EOMI, sclera are clear. Oropharynx is clear. NECK:  No jugular venous distention, carotid upstroke brisk and symmetric, no bruits, no thyromegaly or adenopathy LUNGS:  Clear to auscultation bilaterally CHEST:  Unremarkable HEART:  RRR,  PMI not displaced or sustained,S1 and S2 within normal limits, no S3, no S4: no clicks, no rubs, no murmurs ABD:  Soft, nontender. BS +, no masses or bruits. No hepatomegaly, no splenomegaly EXT:  2 + pulses throughout, no edema, no cyanosis no clubbing SKIN:  Warm and dry.  No rashes NEURO:  Alert and oriented x 3. Cranial nerves II through XII intact. PSYCH:  Cognitively intact    LABORATORY DATA:   Lab Results   Component Value Date   WBC 6.8 08/30/2013   HGB 14.8 08/30/2013   HCT 44.5 08/30/2013   PLT 199.0 08/30/2013   GLUCOSE 97 08/30/2013   CHOL 105 10/04/2013   TRIG 169.0 (H) 10/04/2013   HDL 28.90 (L) 10/04/2013   LDLDIRECT 54.7 11/10/2012   LDLCALC 42 10/04/2013   ALT 18 10/04/2013   AST 18 10/04/2013   NA 141 08/30/2013   K 4.7 08/30/2013   CL 106 08/30/2013   CREATININE 1.1 08/30/2013   BUN 15 08/30/2013   CO2 29 08/30/2013   TSH 1.536 08/19/2010   INR 1.1 (H) 08/30/2013   HGBA1C  08/19/2010    5.3 (NOTE)                                                                       According to the ADA Clinical Practice Recommendations for 2011, when HbA1c is used as a screening test:   >=6.5%   Diagnostic of Diabetes Mellitus           (if abnormal result  is confirmed)  5.7-6.4%   Increased risk of developing Diabetes Mellitus  References:Diagnosis and Classification of Diabetes Mellitus,Diabetes QQPY,1950,93(OIZTI 1):S62-S69 and Standards of Medical Care in         Diabetes - 2011,Diabetes Care,2011,34  (Suppl 1):S11-S61.   Labs reviewed from Dr. Mancel Bale in October 2016. Glucose 105, A1c 5.8%. Other chemistries, CBC and TFTs normal. Cholesterol 124, triglycerides 167, HDL 33, LDL 58.   Ecg today shows sinus brady with HR 52. LAD. No acute changes. I have personally reviewed and interpreted this study.   Assessment / Plan: 1. CAD - prior MI with CABG - s/p cardiac cath Feb 2015 noted above- will continue with medical management. Stable class 1 angina. Continue with CV risk factor modification. On ASA and statin therapy. Follow up in one year  2. HLD - Will request last lab work from Dr. Mancel Bale. On statin  3. HTN - BP controlled. Continue losartan.  I will follow up in one year

## 2019-01-16 ENCOUNTER — Telehealth: Payer: Self-pay | Admitting: Cardiology

## 2019-01-16 NOTE — Telephone Encounter (Signed)

## 2019-01-17 ENCOUNTER — Other Ambulatory Visit: Payer: Self-pay

## 2019-01-17 ENCOUNTER — Ambulatory Visit (INDEPENDENT_AMBULATORY_CARE_PROVIDER_SITE_OTHER): Payer: Medicare Other | Admitting: Cardiology

## 2019-01-17 ENCOUNTER — Encounter: Payer: Self-pay | Admitting: Cardiology

## 2019-01-17 VITALS — BP 134/64 | HR 56 | Temp 97.2°F | Ht 70.5 in | Wt 189.4 lb

## 2019-01-17 DIAGNOSIS — I2510 Atherosclerotic heart disease of native coronary artery without angina pectoris: Secondary | ICD-10-CM | POA: Diagnosis not present

## 2019-01-17 MED ORDER — SILDENAFIL CITRATE 100 MG PO TABS: 100.0000 mg | ORAL_TABLET | Freq: Every day | ORAL | 3 refills | Status: DC | PRN

## 2019-01-17 NOTE — Patient Instructions (Signed)
Continue your current therapy  I will see you in one year   

## 2019-01-19 ENCOUNTER — Telehealth: Payer: Self-pay | Admitting: Cardiology

## 2019-01-19 MED ORDER — SILDENAFIL CITRATE 100 MG PO TABS: 100.0000 mg | ORAL_TABLET | Freq: Every day | ORAL | 3 refills | Status: AC | PRN

## 2019-01-19 NOTE — Telephone Encounter (Signed)
New Message           [facility] for "Sildenafil: (Viagra) 100 mg 20 tab if possible. [facility]. Pls advise.

## 2019-01-19 NOTE — Telephone Encounter (Signed)
Spoke to patient Sildenafil refill sent to [facility].

## 2020-03-15 NOTE — Progress Notes (Signed)
Joshua Mcpherson Date of Birth: [birth year] Medical Record #[id number]  History of Present Illness: Joshua Mcpherson is seen for follow up CAD.  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 Post bypass his ejection fraction was normal at 60%. Other issues include depression, gout, HLD and HTN.  In February 2015 he had an abnormal stress echo - was referred on for cardiac cath. This demonstrated patent grafts to the diagonal and Joshua Mcpherson. The graft to the Joshua Mcpherson was occluded. This vessel was small and medical therapy recommended. Native Joshua Mcpherson was patent. EF normal.  On a prior visit we stopped his beta blocker due to bradycardia. ACEi was switched to losartan due to persistent cough. His cough did resolve.   On follow up today he is doing very well. Denies any chest pain, dyspnea, palpitations. He does play golf and does his yard work.  Feels well.   Current Outpatient Medications  Medication Sig Dispense Refill  . allopurinol (ZYLOPRIM) 300 MG tablet Take 300 mg by mouth every morning.     Marland Kitchen aspirin EC 81 MG tablet Take 81 mg by mouth every morning.    Marland Kitchen atorvastatin (LIPITOR) 20 MG tablet TAKE 1 TABLET (20 MG TOTAL) BY MOUTH DAILY AT 6 PM. 90 tablet 3  . losartan (COZAAR) 50 MG tablet Take 50 mg by mouth daily.    . sildenafil (VIAGRA) 100 MG tablet Take 1 tablet (100 mg total) by mouth daily as needed for erectile dysfunction. 10 tablet 3  . nitroGLYCERIN (NITROSTAT) 0.4 MG SL tablet Place 1 tablet (0.4 mg total) under the tongue every 5 (five) minutes as needed for chest pain. (Patient not taking: Reported on 03/18/2020) 25 tablet 3   No current facility-administered medications for this visit.    Allergies  Allergen Reactions  . Lisinopril Cough    Past Medical History:  Diagnosis Date  . Anterolateral myocardial infarction [facility]) 08/19/10   STEMI  . CAD (coronary artery disease)    . Depression   . Fracture    MULTIPLE  . Gout   . LV dysfunction     Past Surgical History:  Procedure Laterality Date  . CARDIAC CATHETERIZATION    . CORONARY ARTERY BYPASS GRAFT    . LEFT HEART CATHETERIZATION WITH CORONARY/GRAFT ANGIOGRAM N/A 09/01/2013   Procedure: LEFT HEART CATHETERIZATION WITH Joshua Mcpherson;  Surgeon: Adhrit Krenz M [place], MD;  Location: [facility] CATH LAB;  Service: Cardiovascular;  Laterality: N/A;  . Multiple fractures    . TONSILLECTOMY      Social History   Tobacco Use  Smoking Status Former Smoker  . Packs/day: 0.50  . Years: 15.00  . Pack years: 7.50  . Types: Cigarettes  . Quit date: 07/20/1968  . Years since quitting: 51.6  Smokeless Tobacco Never Used    Social History   Substance and Sexual Activity  Alcohol Use Yes  . Alcohol/week: 3.0 standard drinks  . Types: 3 Cans of beer per week   Comment: OCCASSIONALY    Family History  Problem Relation Age of Onset  . Heart disease Father     Review of Systems: The review of systems is per the HPI.  All other systems were reviewed and are negative.  Physical Exam: BP (!) 148/62 (BP Location: Left Arm, Cuff Size: Normal)   Pulse (!) 50   Ht 5' 10.5" (1.791 m)  Wt 187 lb 9.6 oz (85.1 kg)   SpO2 98%   BMI 26.54 kg/m  GENERAL:  Well appearing Joshua Mcpherson in NAD HEENT:  PERRL, EOMI, sclera are clear. Oropharynx is clear. NECK:  No jugular venous distention, carotid upstroke brisk and symmetric, no bruits, no thyromegaly or adenopathy LUNGS:  Clear to auscultation bilaterally CHEST:  Unremarkable HEART:  RRR,  PMI not displaced or sustained,S1 and S2 within normal limits, no S3, no S4: no clicks, no rubs, no murmurs ABD:  Soft, nontender. BS +, no masses or bruits. No hepatomegaly, no splenomegaly EXT:  2 + pulses throughout, no edema, no cyanosis no clubbing SKIN:  Warm and dry.  No rashes NEURO:  Alert and oriented x 3. Cranial nerves II through XII intact. PSYCH:  Cognitively  intact    LABORATORY DATA:   Lab Results  Component Value Date   WBC 6.8 08/30/2013   HGB 14.8 08/30/2013   HCT 44.5 08/30/2013   PLT 199.0 08/30/2013   GLUCOSE 97 08/30/2013   CHOL 105 10/04/2013   TRIG 169.0 (H) 10/04/2013   HDL 28.90 (L) 10/04/2013   LDLDIRECT 54.7 11/10/2012   LDLCALC 42 10/04/2013   ALT 18 10/04/2013   AST 18 10/04/2013   NA 141 08/30/2013   K 4.7 08/30/2013   CL 106 08/30/2013   CREATININE 1.1 08/30/2013   BUN 15 08/30/2013   CO2 29 08/30/2013   TSH 1.536 08/19/2010   INR 1.1 (H) 08/30/2013   HGBA1C  08/19/2010    5.3 (NOTE)                                                                       According to the ADA Clinical Practice Recommendations for 2011, when HbA1c is used as a screening test:   >=6.5%   Diagnostic of Diabetes Mellitus           (if abnormal result  is confirmed)  5.7-6.4%   Increased risk of developing Diabetes Mellitus  References:Diagnosis and Classification of Diabetes Mellitus,Diabetes Care,2011,34(Suppl 1):S62-S69 and Standards of Medical Care in         Diabetes - 2011,Diabetes Care,2011,34  (Suppl 1):S11-S61.   Labs reviewed from Joshua. Su Mcpherson in October 2016. Glucose 105, A1c 5.8%. Other chemistries, CBC and TFTs normal. Cholesterol 124, triglycerides 167, HDL 33, LDL 58.  Dated 05/17/18: CBC, CMET, TSH normal. Cholesterol 127, triglycerides 126, HDL 40, LDL 62. A1c 5.3%  Ecg today shows sinus brady with HR 50. Joshua Mcpherson. No acute changes. I have personally reviewed and interpreted this study.   Assessment / Plan: 1. CAD - prior MI with CABG - s/p cardiac cath Feb 2015 noted above- will continue with medical management. Stable class 0-1 angina. Continue with CV risk factor modification. On ASA and statin therapy. Follow up in one year  2. HLD - On statin. Labs followed by Joshua Mcpherson.   3. HTN - reports BP controlled at home. Continue losartan.  I will follow up in one year

## 2020-03-18 ENCOUNTER — Encounter: Payer: Self-pay | Admitting: Cardiology

## 2020-03-18 ENCOUNTER — Ambulatory Visit (INDEPENDENT_AMBULATORY_CARE_PROVIDER_SITE_OTHER): Payer: Medicare Other | Admitting: Cardiology

## 2020-03-18 ENCOUNTER — Telehealth: Payer: Self-pay | Admitting: Cardiology

## 2020-03-18 ENCOUNTER — Other Ambulatory Visit: Payer: Self-pay

## 2020-03-18 VITALS — BP 148/62 | HR 50 | Ht 70.5 in | Wt 187.6 lb

## 2020-03-18 DIAGNOSIS — I2510 Atherosclerotic heart disease of native coronary artery without angina pectoris: Secondary | ICD-10-CM

## 2020-03-18 DIAGNOSIS — I10 Essential (primary) hypertension: Secondary | ICD-10-CM

## 2020-03-18 DIAGNOSIS — E7800 Pure hypercholesterolemia, unspecified: Secondary | ICD-10-CM | POA: Diagnosis not present

## 2020-03-18 NOTE — Telephone Encounter (Signed)
*  STAT* If patient is at the pharmacy, call can be transferred to refill team.   1. Which medications need to be refilled? (please list name of each medication and dose if known)  New prescription for Nitroglycerin  2. Which pharmacy/location (including street and city if local pharmacy) is medication [facility] sent to[facility] [place]   3. Do they need a 30 day or 90 day supply?

## 2020-03-19 ENCOUNTER — Other Ambulatory Visit: Payer: Self-pay

## 2020-03-19 MED ORDER — NITROGLYCERIN 0.4 MG SL SUBL: 0.4000 mg | SUBLINGUAL_TABLET | SUBLINGUAL | 1 refills | Status: AC | PRN

## 2020-08-30 ENCOUNTER — Encounter (INDEPENDENT_AMBULATORY_CARE_PROVIDER_SITE_OTHER): Payer: Self-pay | Admitting: Ophthalmology

## 2020-09-04 ENCOUNTER — Encounter (INDEPENDENT_AMBULATORY_CARE_PROVIDER_SITE_OTHER): Payer: Medicare Other | Admitting: Ophthalmology

## 2020-09-04 ENCOUNTER — Other Ambulatory Visit: Payer: Self-pay

## 2020-09-04 DIAGNOSIS — I10 Essential (primary) hypertension: Secondary | ICD-10-CM | POA: Diagnosis not present

## 2020-09-04 DIAGNOSIS — H33301 Unspecified retinal break, right eye: Secondary | ICD-10-CM | POA: Diagnosis not present

## 2020-09-04 DIAGNOSIS — H35033 Hypertensive retinopathy, bilateral: Secondary | ICD-10-CM

## 2020-09-04 DIAGNOSIS — H2513 Age-related nuclear cataract, bilateral: Secondary | ICD-10-CM

## 2020-09-04 DIAGNOSIS — H43813 Vitreous degeneration, bilateral: Secondary | ICD-10-CM

## 2020-09-18 ENCOUNTER — Encounter (INDEPENDENT_AMBULATORY_CARE_PROVIDER_SITE_OTHER): Payer: Medicare Other | Admitting: Ophthalmology

## 2020-09-18 ENCOUNTER — Other Ambulatory Visit: Payer: Self-pay

## 2020-09-18 DIAGNOSIS — H33301 Unspecified retinal break, right eye: Secondary | ICD-10-CM

## 2020-11-19 ENCOUNTER — Telehealth: Payer: Self-pay | Admitting: Cardiology

## 2020-11-19 NOTE — Telephone Encounter (Signed)
Pt c/o medication issue:  1. Name of Medication: Benadryl  2. How are you currently taking this medication (dosage and times per day)? n/a  3. Are you having a reaction (difficulty breathing--STAT)? No  4. What is your medication issue? Patient has poison ivy and his wife wants to know if he can take benadryl with his cardiac medications. Please advise.

## 2020-11-19 NOTE — Telephone Encounter (Signed)
Called patient, gave response from PharmD.  Patient verbalized understanding.  

## 2020-11-19 NOTE — Telephone Encounter (Signed)
Okay to take?   Thank you!

## 2020-11-19 NOTE — Telephone Encounter (Signed)
Benadryl is fine with his current meds.

## 2021-01-22 ENCOUNTER — Other Ambulatory Visit: Payer: Self-pay

## 2021-01-22 ENCOUNTER — Encounter (INDEPENDENT_AMBULATORY_CARE_PROVIDER_SITE_OTHER): Payer: Medicare Other | Admitting: Ophthalmology

## 2021-01-22 DIAGNOSIS — H33301 Unspecified retinal break, right eye: Secondary | ICD-10-CM | POA: Diagnosis not present

## 2021-01-22 DIAGNOSIS — H43813 Vitreous degeneration, bilateral: Secondary | ICD-10-CM

## 2021-01-22 DIAGNOSIS — I10 Essential (primary) hypertension: Secondary | ICD-10-CM

## 2021-01-22 DIAGNOSIS — H35033 Hypertensive retinopathy, bilateral: Secondary | ICD-10-CM

## 2021-05-26 ENCOUNTER — Encounter: Payer: Self-pay | Admitting: Internal Medicine

## 2021-06-23 NOTE — Progress Notes (Deleted)
Joshua Mcpherson Date of Birth: 1945/09/28 Medical Record #[id number]  History of Present Illness: Mr. Kolodziejski is seen for follow up CAD.  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 Post bypass his ejection fraction was normal at 60%. Other issues include depression, gout, HLD and HTN.  In February 2015 he had an abnormal stress echo - was referred on for cardiac cath. This demonstrated patent grafts to the diagonal and LAD. The graft to the OM was occluded. This vessel was small and medical therapy recommended. Native RCA was patent. EF normal.  On a prior visit we stopped his beta blocker due to bradycardia. ACEi was switched to losartan due to persistent cough. His cough did resolve.   On follow up today he is doing very well. Denies any chest pain, dyspnea, palpitations. He does play golf and does his yard work.  Feels well.   Current Outpatient Medications  Medication Sig Dispense Refill   allopurinol (ZYLOPRIM) 300 MG tablet Take 300 mg by mouth every morning.      aspirin EC 81 MG tablet Take 81 mg by mouth every morning.     atorvastatin (LIPITOR) 20 MG tablet TAKE 1 TABLET (20 MG TOTAL) BY MOUTH DAILY AT 6 PM. 90 tablet 3   losartan (COZAAR) 50 MG tablet Take 50 mg by mouth daily.     nitroGLYCERIN (NITROSTAT) 0.4 MG SL tablet Place 1 tablet (0.4 mg total) under the tongue every 5 (five) minutes as needed for chest pain. 25 tablet 1   sildenafil (VIAGRA) 100 MG tablet Take 1 tablet (100 mg total) by mouth daily as needed for erectile dysfunction. 10 tablet 3   No current facility-administered medications for this visit.    Allergies  Allergen Reactions   Lisinopril Cough    Past Medical History:  Diagnosis Date   Anterolateral myocardial infarction (HCC) 08/19/10   STEMI   CAD (coronary artery disease)    Depression    Fracture    MULTIPLE   Gout     LV dysfunction     Past Surgical History:  Procedure Laterality Date   CARDIAC CATHETERIZATION     CORONARY ARTERY BYPASS GRAFT     LEFT HEART CATHETERIZATION WITH CORONARY/GRAFT ANGIOGRAM N/A 09/01/2013   Procedure: LEFT HEART CATHETERIZATION WITH Isabel Caprice;  Surgeon: Tomasz Steeves M [place], MD;  Location: [facility] CATH LAB;  Service: Cardiovascular;  Laterality: N/A;   Multiple fractures     TONSILLECTOMY      Social History   Tobacco Use  Smoking Status Former   Packs/day: 0.50   Years: 15.00   Pack years: 7.50   Types: Cigarettes   Quit date: 07/20/1968   Years since quitting: 52.9  Smokeless Tobacco Never    Social History   Substance and Sexual Activity  Alcohol Use Yes   Alcohol/week: 3.0 standard drinks   Types: 3 Cans of beer per week   Comment: OCCASSIONALY    Family History  Problem Relation Age of Onset   Heart disease Father     Review of Systems: The review of systems is per the HPI.  All other systems were reviewed and are negative.  Physical Exam: There were no vitals taken for this visit. GENERAL:  Well appearing WM in NAD HEENT:  PERRL, EOMI, sclera are clear. Oropharynx is clear. NECK:  No jugular venous  distention, carotid upstroke brisk and symmetric, no bruits, no thyromegaly or adenopathy LUNGS:  Clear to auscultation bilaterally CHEST:  Unremarkable HEART:  RRR,  PMI not displaced or sustained,S1 and S2 within normal limits, no S3, no S4: no clicks, no rubs, no murmurs ABD:  Soft, nontender. BS +, no masses or bruits. No hepatomegaly, no splenomegaly EXT:  2 + pulses throughout, no edema, no cyanosis no clubbing SKIN:  Warm and dry.  No rashes NEURO:  Alert and oriented x 3. Cranial nerves II through XII intact. PSYCH:  Cognitively intact    LABORATORY DATA:   Lab Results  Component Value Date   WBC 6.8 08/30/2013   HGB 14.8 08/30/2013   HCT 44.5 08/30/2013   PLT 199.0 08/30/2013   GLUCOSE 97 08/30/2013   CHOL 105 10/04/2013    TRIG 169.0 (H) 10/04/2013   HDL 28.90 (L) 10/04/2013   LDLDIRECT 54.7 11/10/2012   LDLCALC 42 10/04/2013   ALT 18 10/04/2013   AST 18 10/04/2013   NA 141 08/30/2013   K 4.7 08/30/2013   CL 106 08/30/2013   CREATININE 1.1 08/30/2013   BUN 15 08/30/2013   CO2 29 08/30/2013   TSH 1.536 08/19/2010   INR 1.1 (H) 08/30/2013   HGBA1C  08/19/2010    5.3 (NOTE)                                                                       According to the ADA Clinical Practice Recommendations for 2011, when HbA1c is used as a screening test:   >=6.5%   Diagnostic of Diabetes Mellitus           (if abnormal result  is confirmed)  5.7-6.4%   Increased risk of developing Diabetes Mellitus  References:Diagnosis and Classification of Diabetes Mellitus,Diabetes Care,2011,34(Suppl 1):S62-S69 and Standards of Medical Care in         Diabetes - 2011,Diabetes Care,2011,34  (Suppl 1):S11-S61.   Labs reviewed from Dr. Su Hilt in October 2016. Glucose 105, A1c 5.8%. Other chemistries, CBC and TFTs normal. Cholesterol 124, triglycerides 167, HDL 33, LDL 58.  Dated 05/17/18: CBC, CMET, TSH normal. Cholesterol 127, triglycerides 126, HDL 40, LDL 62. A1c 5.3%  Ecg today shows sinus brady with HR 50. LAD. No acute changes. I have personally reviewed and interpreted this study.   Assessment / Plan: 1. CAD - prior MI with CABG - s/p cardiac cath Feb 2015 noted above- will continue with medical management. Stable class 0-1 angina. Continue with CV risk factor modification. On ASA and statin therapy. Follow up in one year  2. HLD - On statin. Labs followed by Dr Su Hilt.   3. HTN - reports BP controlled at home. Continue losartan.  I will follow up in one year

## 2021-06-27 ENCOUNTER — Ambulatory Visit: Payer: BC Managed Care – PPO | Admitting: Cardiology

## 2021-07-28 NOTE — Progress Notes (Signed)
Joshua Mcpherson Date of Birth: 03/22/1946 Medical Record #[id number]  History of Present Illness: Mr. Main is seen for follow up CAD.  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 Post bypass his ejection fraction was normal at 60%. Other issues include depression, gout, HLD and HTN.  In February 2015 he had an abnormal stress echo - was referred on for cardiac cath. This demonstrated patent grafts to the diagonal and LAD. The graft to the OM was occluded. This vessel was small and medical therapy recommended. Native RCA was patent. EF normal.  On a prior visit we stopped his beta blocker due to bradycardia. ACEi was switched to losartan due to persistent cough. His cough did resolve.   On follow up today he is doing very well. Denies any chest pain, dyspnea, palpitations. He is not as active. Still mourning the loss of his brother Lollie Sails. He has lost 10 lbs. He does have a history of bad nightmares.  Current Outpatient Medications  Medication Sig Dispense Refill   allopurinol (ZYLOPRIM) 300 MG tablet Take 300 mg by mouth every morning.      aspirin EC 81 MG tablet Take 81 mg by mouth every morning.     atorvastatin (LIPITOR) 20 MG tablet TAKE 1 TABLET (20 MG TOTAL) BY MOUTH DAILY AT 6 PM. 90 tablet 3   losartan (COZAAR) 50 MG tablet Take 50 mg by mouth daily.     nitroGLYCERIN (NITROSTAT) 0.4 MG SL tablet Place 1 tablet (0.4 mg total) under the tongue every 5 (five) minutes as needed for chest pain. 25 tablet 1   sildenafil (VIAGRA) 100 MG tablet Take 1 tablet (100 mg total) by mouth daily as needed for erectile dysfunction. 10 tablet 3   traZODone (DESYREL) 50 MG tablet Take by mouth daily.     No current facility-administered medications for this visit.    Allergies  Allergen Reactions   Lisinopril Cough    Past Medical History:  Diagnosis Date   Anterolateral  myocardial infarction (HCC) 08/19/10   STEMI   CAD (coronary artery disease)    Depression    Fracture    MULTIPLE   Gout    LV dysfunction     Past Surgical History:  Procedure Laterality Date   CARDIAC CATHETERIZATION     CORONARY ARTERY BYPASS GRAFT     LEFT HEART CATHETERIZATION WITH CORONARY/GRAFT ANGIOGRAM N/A 09/01/2013   Procedure: LEFT HEART CATHETERIZATION WITH Isabel Caprice;  Surgeon: Khristie Sak M [place], MD;  Location: [facility] CATH LAB;  Service: Cardiovascular;  Laterality: N/A;   Multiple fractures     TONSILLECTOMY      Social History   Tobacco Use  Smoking Status Former   Packs/day: 0.50   Years: 15.00   Pack years: 7.50   Types: Cigarettes   Quit date: 07/20/1968   Years since quitting: 53.0  Smokeless Tobacco Never    Social History   Substance and Sexual Activity  Alcohol Use Yes   Alcohol/week: 3.0 standard drinks   Types: 3 Cans of beer per week   Comment: OCCASSIONALY    Family History  Problem Relation Age of Onset   Heart disease Father     Review of Systems: The review of systems is per the HPI.  All other systems were reviewed and are negative.  Physical Exam: BP 120/60  Pulse (!) 52    Ht 5' 10.5" (1.791 m)    Wt 177 lb 6.4 oz (80.5 kg)    SpO2 97%    BMI 25.09 kg/m  GENERAL:  Well appearing WM in NAD HEENT:  PERRL, EOMI, sclera are clear. Oropharynx is clear. NECK:  No jugular venous distention, carotid upstroke brisk and symmetric, no bruits, no thyromegaly or adenopathy LUNGS:  Clear to auscultation bilaterally CHEST:  Unremarkable HEART:  RRR,  PMI not displaced or sustained,S1 and S2 within normal limits, no S3, no S4: no clicks, no rubs, no murmurs ABD:  Soft, nontender. BS +, no masses or bruits. No hepatomegaly, no splenomegaly EXT:  2 + pulses throughout, no edema, no cyanosis no clubbing SKIN:  Warm and dry.  No rashes NEURO:  Alert and oriented x 3. Cranial nerves II through XII intact. PSYCH:  Cognitively  intact    LABORATORY DATA:   Lab Results  Component Value Date   WBC 6.8 08/30/2013   HGB 14.8 08/30/2013   HCT 44.5 08/30/2013   PLT 199.0 08/30/2013   GLUCOSE 97 08/30/2013   CHOL 105 10/04/2013   TRIG 169.0 (H) 10/04/2013   HDL 28.90 (L) 10/04/2013   LDLDIRECT 54.7 11/10/2012   LDLCALC 42 10/04/2013   ALT 18 10/04/2013   AST 18 10/04/2013   NA 141 08/30/2013   K 4.7 08/30/2013   CL 106 08/30/2013   CREATININE 1.1 08/30/2013   BUN 15 08/30/2013   CO2 29 08/30/2013   TSH 1.536 08/19/2010   INR 1.1 (H) 08/30/2013   HGBA1C  08/19/2010    5.3 (NOTE)                                                                       According to the ADA Clinical Practice Recommendations for 2011, when HbA1c is used as a screening test:   >=6.5%   Diagnostic of Diabetes Mellitus           (if abnormal result  is confirmed)  5.7-6.4%   Increased risk of developing Diabetes Mellitus  References:Diagnosis and Classification of Diabetes Mellitus,Diabetes Care,2011,34(Suppl 1):S62-S69 and Standards of Medical Care in         Diabetes - 2011,Diabetes Care,2011,34  (Suppl 1):S11-S61.   Labs reviewed from Dr. Su Hilt in October 2016. Glucose 105, A1c 5.8%. Other chemistries, CBC and TFTs normal. Cholesterol 124, triglycerides 167, HDL 33, LDL 58.  Dated 05/17/18: CBC, CMET, TSH normal. Cholesterol 127, triglycerides 126, HDL 40, LDL 62. A1c 5.3%  Ecg today shows sinus brady with HR 52. LAD. No acute changes. I have personally reviewed and interpreted this study.   Assessment / Plan: 1. CAD - prior MI with CABG - s/p cardiac cath Feb 2015 noted above- will continue with medical management. Stable class 0-1 angina. Continue with CV risk factor modification. On ASA and statin therapy. Follow up in one year  2. HLD - On statin. Labs followed by Dr Su Hilt.   3. HTN - well controlled.  Continue losartan.  I will follow up in one year

## 2021-08-08 ENCOUNTER — Ambulatory Visit (INDEPENDENT_AMBULATORY_CARE_PROVIDER_SITE_OTHER): Payer: Medicare Other | Admitting: Cardiology

## 2021-08-08 ENCOUNTER — Encounter: Payer: Self-pay | Admitting: Cardiology

## 2021-08-08 ENCOUNTER — Other Ambulatory Visit: Payer: Self-pay

## 2021-08-08 VITALS — BP 120/60 | HR 52 | Ht 70.5 in | Wt 177.4 lb

## 2021-08-08 DIAGNOSIS — I2510 Atherosclerotic heart disease of native coronary artery without angina pectoris: Secondary | ICD-10-CM

## 2021-08-08 DIAGNOSIS — E7800 Pure hypercholesterolemia, unspecified: Secondary | ICD-10-CM

## 2021-08-08 DIAGNOSIS — I10 Essential (primary) hypertension: Secondary | ICD-10-CM | POA: Diagnosis not present

## 2021-08-12 ENCOUNTER — Telehealth: Payer: Self-pay | Admitting: Cardiology

## 2021-08-12 NOTE — Telephone Encounter (Signed)
Returned the call the patient. He has been made aware that the EKG from his office visit had been scanned. Nothing further needed at this time.

## 2021-08-12 NOTE — Telephone Encounter (Signed)
Pt states that she received a message in [place] stating that there are new test results.. pt is not aware of any recent test and would like to speak with the nurse.. please advise

## 2022-01-22 ENCOUNTER — Encounter (INDEPENDENT_AMBULATORY_CARE_PROVIDER_SITE_OTHER): Payer: Medicare Other | Admitting: Ophthalmology

## 2022-01-29 ENCOUNTER — Encounter (INDEPENDENT_AMBULATORY_CARE_PROVIDER_SITE_OTHER): Payer: Medicare Other | Admitting: Ophthalmology

## 2022-01-29 DIAGNOSIS — H43813 Vitreous degeneration, bilateral: Secondary | ICD-10-CM

## 2022-01-29 DIAGNOSIS — H35033 Hypertensive retinopathy, bilateral: Secondary | ICD-10-CM

## 2022-01-29 DIAGNOSIS — I10 Essential (primary) hypertension: Secondary | ICD-10-CM

## 2022-01-29 DIAGNOSIS — H33301 Unspecified retinal break, right eye: Secondary | ICD-10-CM

## 2022-01-29 DIAGNOSIS — H34212 Partial retinal artery occlusion, left eye: Secondary | ICD-10-CM | POA: Diagnosis not present

## 2022-01-29 LAB — HM DIABETES EYE EXAM

## 2022-02-03 ENCOUNTER — Encounter: Payer: Self-pay | Admitting: Nurse Practitioner

## 2022-02-03 ENCOUNTER — Ambulatory Visit (INDEPENDENT_AMBULATORY_CARE_PROVIDER_SITE_OTHER): Payer: Medicare Other | Admitting: Nurse Practitioner

## 2022-02-03 VITALS — BP 116/60 | HR 56 | Ht 70.5 in | Wt 174.0 lb

## 2022-02-03 DIAGNOSIS — H539 Unspecified visual disturbance: Secondary | ICD-10-CM

## 2022-02-03 DIAGNOSIS — I708 Atherosclerosis of other arteries: Secondary | ICD-10-CM

## 2022-02-03 DIAGNOSIS — I25118 Atherosclerotic heart disease of native coronary artery with other forms of angina pectoris: Secondary | ICD-10-CM | POA: Diagnosis not present

## 2022-02-03 DIAGNOSIS — I10 Essential (primary) hypertension: Secondary | ICD-10-CM | POA: Diagnosis not present

## 2022-02-03 DIAGNOSIS — R001 Bradycardia, unspecified: Secondary | ICD-10-CM | POA: Diagnosis not present

## 2022-02-03 DIAGNOSIS — H3509 Other intraretinal microvascular abnormalities: Secondary | ICD-10-CM

## 2022-02-03 DIAGNOSIS — E785 Hyperlipidemia, unspecified: Secondary | ICD-10-CM | POA: Diagnosis not present

## 2022-02-03 NOTE — Patient Instructions (Signed)
Medication Instructions:  Your physician recommends that you continue on your current medications as directed. Please refer to the Current Medication list given to you today.   *If you need a refill on your cardiac medications before your next appointment, please call your pharmacy*   Lab Work: Your physician recommends that you complete labs when you don't take Asprin for 24 hours.  Lipoprotein A  If you have labs (blood work) drawn today and your tests are completely normal, you will receive your results only by: MyChart Message (if you have MyChart) OR A paper copy in the mail If you have any lab test that is abnormal or we need to change your treatment, we will call you to review the results.   Testing/Procedures: Your physician has requested that you have an echocardiogram. Echocardiography is a painless test that uses sound waves to create images of your heart. It provides your doctor with information about the size and shape of your heart and how well your heart's chambers and valves are working. This procedure takes approximately one hour. There are no restrictions for this procedure.   Your physician has requested that you have a carotid doppler. This test is an ultrasound of the carotid arteries in your neck. It looks at blood flow through these arteries that supply the brain with blood. Allow one hour for this exam. There are no restrictions or special instructions.  Follow-Up: At [facility], you and your health needs are our priority.  As part of our continuing mission to provide you with exceptional heart care, we have created designated Provider Care Teams.  These Care Teams include your primary Cardiologist (physician) and Advanced Practice Providers (APPs -  Physician Assistants and Nurse Practitioners) who all work together to provide you with the care you need, when you need it.  We recommend signing up for the patient portal called "MyChart".  Sign up information is  provided on this After Visit Summary.  MyChart is used to connect with patients for Virtual Visits (Telemedicine).  Patients are able to view lab/test results, encounter notes, upcoming appointments, etc.  Non-urgent messages can be sent to your provider as well.   To learn more about what you can do with MyChart, go to [email or web address].    Your next appointment:   2-3 month(s)  The format for your next appointment:   In Person  Provider:   Peter [place], MD  or Bernadene Person, NP        Other Instructions   Important Information About Sugar

## 2022-02-03 NOTE — Progress Notes (Signed)
Office Visit    Patient Name: Joshua Mcpherson Date of Encounter: 02/03/2022  Primary Care Provider:  Burton Apley, MD Primary Cardiologist:  Peter [place], MD  Chief Complaint    76 year old male with a history of CAD s/p STEMI, CABG x3 in 2012, bradycardia, hypertension, hyperlipidemia, gout, and depression who presents for follow-up related to CAD and hypertension.  Past Medical History    Past Medical History:  Diagnosis Date   Anterolateral myocardial infarction (HCC) 08/19/10   STEMI   CAD (coronary artery disease)    Depression    Fracture    MULTIPLE   Gout    LV dysfunction    Past Surgical History:  Procedure Laterality Date   CARDIAC CATHETERIZATION     CORONARY ARTERY BYPASS GRAFT     LEFT HEART CATHETERIZATION WITH CORONARY/GRAFT ANGIOGRAM N/A 09/01/2013   Procedure: LEFT HEART CATHETERIZATION WITH Isabel Caprice;  Surgeon: Peter M [place], MD;  Location: [facility] CATH LAB;  Service: Cardiovascular;  Laterality: N/A;   Multiple fractures     TONSILLECTOMY      Allergies  Allergies  Allergen Reactions   Lisinopril Cough    History of Present Illness    76 year old male with the above past medical history including CAD s/p STEMI, CABG x3 in 2012, bradycardia, hypertension, hyperlipidemia, gout, and depression.  He has a history of anterolateral MI in January 2012 s/p CABG x3 (LIMA-LAD, SVG-Diagonal and OM).  Post bypass his EF was normal at 60%.  He had an abnormal stress echo in February 2015 and was referred for cardiac catheterization which showed patent grafts to the diagonal and LAD.  The graft to the OM was occluded.  However, this was a small vessel, medical therapy was recommended.  Additionally native RCA was patent, EF was normal.  Beta-blocker was discontinued in the setting of bradycardia.  Additionally, his ACEi was switched to losartan due to persistent cough.  He was last seen in the office on 08/08/2021 and was doing well from a cardiac  standpoint.  He denied any symptoms concerning for angina.    He presents today for follow-up accompanied by his wife.  Since his last visit stable overall from a cardiac standpoint though he did have an episode that occurred approximately 6 weeks ago while watching TV during which he felt "an explosion" in his left eye associated vision changes that eventually resolved.  He saw Dr. Ashley Royalty, retina specialist, who identified arterial plaque in the left eye.  He was advised to follow-up with cardiology.  He denies any further vision changes, denies symptoms concerning for stroke, denies symptoms concerning for angina.  Other than his newly identified retinal artery plaque, he denies any additional concerns today.  Home Medications    Current Outpatient Medications  Medication Sig Dispense Refill   allopurinol (ZYLOPRIM) 300 MG tablet Take 300 mg by mouth every morning.      aspirin EC 81 MG tablet Take 81 mg by mouth every morning.     atorvastatin (LIPITOR) 20 MG tablet TAKE 1 TABLET (20 MG TOTAL) BY MOUTH DAILY AT 6 PM. 90 tablet 3   losartan (COZAAR) 50 MG tablet Take 50 mg by mouth daily.     nitroGLYCERIN (NITROSTAT) 0.4 MG SL tablet Place 1 tablet (0.4 mg total) under the tongue every 5 (five) minutes as needed for chest pain. 25 tablet 1   sildenafil (VIAGRA) 100 MG tablet Take 1 tablet (100 mg total) by mouth daily as needed for erectile dysfunction. 10  tablet 3   traZODone (DESYREL) 50 MG tablet Take 100 mg by mouth daily.     No current facility-administered medications for this visit.     Review of Systems    He denies chest pain, palpitations, dyspnea, pnd, orthopnea, n, v, dizziness, syncope, edema, weight gain, or early satiety. All other systems reviewed and are otherwise negative except as noted above.   Physical Exam    VS:  BP 116/60   Pulse (!) 56   Ht 5' 10.5" (1.791 m)   Wt 174 lb (78.9 kg)   SpO2 97%   BMI 24.61 kg/m  GEN: Well nourished, well developed, in no  acute distress. HEENT: normal. Neck: Supple, no JVD, carotid bruits, or masses. Cardiac: RRR, no murmurs, rubs, or gallops. No clubbing, cyanosis, edema.  Radials/DP/PT 2+ and equal bilaterally.  Respiratory:  Respirations regular and unlabored, clear to auscultation bilaterally. GI: Soft, nontender, nondistended, BS + x 4. MS: no deformity or atrophy. Skin: warm and dry, no rash. Neuro:  Strength and sensation are intact. Psych: Normal affect.  Accessory Clinical Findings    ECG personally reviewed by me today -sinus bradycardia, 56 bpm,  LAFB/LAD- no acute changes.  Lab Results  Component Value Date   WBC 6.8 08/30/2013   HGB 14.8 08/30/2013   HCT 44.5 08/30/2013   MCV 91.3 08/30/2013   PLT 199.0 08/30/2013   Lab Results  Component Value Date   CREATININE 1.1 08/30/2013   BUN 15 08/30/2013   NA 141 08/30/2013   K 4.7 08/30/2013   CL 106 08/30/2013   CO2 29 08/30/2013   Lab Results  Component Value Date   ALT 18 10/04/2013   AST 18 10/04/2013   ALKPHOS 82 10/04/2013   BILITOT 0.9 10/04/2013   Lab Results  Component Value Date   CHOL 105 10/04/2013   HDL 28.90 (L) 10/04/2013   LDLCALC 42 10/04/2013   LDLDIRECT 54.7 11/10/2012   TRIG 169.0 (H) 10/04/2013   CHOLHDL 4 10/04/2013    Lab Results  Component Value Date   HGBA1C  08/19/2010    5.3 (NOTE)                                                                       According to the ADA Clinical Practice Recommendations for 2011, when HbA1c is used as a screening test:   >=6.5%   Diagnostic of Diabetes Mellitus           (if abnormal result  is confirmed)  5.7-6.4%   Increased risk of developing Diabetes Mellitus  References:Diagnosis and Classification of Diabetes Mellitus,Diabetes Care,2011,34(Suppl 1):S62-S69 and Standards of Medical Care in         Diabetes - 2011,Diabetes Care,2011,34  (Suppl 1):S11-S61.    Assessment & Plan    1. Retinal artery plaque: He had recent visual changes which prompted  evaluation with a retinal specialist who identified arterial plaque in the left eye.  He was advised to follow-up with cardiology.  He denies any further vision changes, denies symptoms concerning for stroke.  Will check carotid dopplers, echo, lipoprotein A. LDL was 62 in 2019.  Will request most recent lipid profile from PCP.  If LDL remains elevated above goal, could consider PCSK9  inhibitor.  BP well controlled. Continue asa, lipitor.   2. CAD: S/p CABG x3 (LIMA-LAD, SVG-Diagonal and OM) in 2012.  Abnormal stress echo in February 2015 repeat cath showed patent grafts to the diagonal and LAD, occluded graft to OM-this was a small vessel, medical therapy was recommended. Stable with no anginal symptoms. No indication for ischemic evaluation.  Continue aspirin, losartan, Lipitor.  3. Bradycardia: Previously on beta-blocker, however this was discontinued.  EKG today shows sinus bradycardia, 56 bpm.  Asymptomatic.  4. Hypertension: BP well controlled. Continue current antihypertensive regimen.   5. Hyperlipidemia: LDL was 62 in 2019.  He has had labs updated since this time. We will request most recent labs from PCP.  Continue aspirin, Lipitor.  6. Disposition: Follow-up in 2-3 months.   Lenna Sciara, NP 02/03/2022, 4:04 PM

## 2022-02-04 ENCOUNTER — Other Ambulatory Visit: Payer: Self-pay | Admitting: Nurse Practitioner

## 2022-02-04 ENCOUNTER — Telehealth: Payer: Self-pay | Admitting: Cardiology

## 2022-02-04 DIAGNOSIS — H3509 Other intraretinal microvascular abnormalities: Secondary | ICD-10-CM

## 2022-02-04 DIAGNOSIS — H539 Unspecified visual disturbance: Secondary | ICD-10-CM

## 2022-02-04 NOTE — Telephone Encounter (Signed)
Patient is calling to get results. Says that they are not good using mychart, so they would like for someone to please go over the results with them

## 2022-02-04 NOTE — Telephone Encounter (Signed)
Spoke with pt regarding results. Only new result that is showing is where EKG was scanned into Epic. This was reviewed with pt at office visit with Irving Burton, NP yesterday. Pt verbalizes understanding.

## 2022-02-04 NOTE — Telephone Encounter (Signed)
Left message for pt to call back  °

## 2022-02-05 ENCOUNTER — Telehealth: Payer: Self-pay | Admitting: Cardiology

## 2022-02-05 LAB — LIPOPROTEIN A (LPA): Lipoprotein (a): 54.3 nmol/L (ref <75.0)

## 2022-02-05 NOTE — Telephone Encounter (Signed)
Spoke to patient he was calling for [facility] results. Advised results not available I will send message to [facility] .

## 2022-02-05 NOTE — Telephone Encounter (Signed)
   Pt's wife is calling because they received a notification on mychart about pt's lab result. She said, they don't have computer and cant lo in to mychart to check what is the result

## 2022-02-10 ENCOUNTER — Telehealth: Payer: Self-pay

## 2022-02-10 NOTE — Telephone Encounter (Signed)
Spoke with pt. Pt was notified of results, recommendations and will follow up as planned.

## 2022-02-11 ENCOUNTER — Ambulatory Visit (HOSPITAL_COMMUNITY)
Admission: RE | Admit: 2022-02-11 | Discharge: 2022-02-11 | Disposition: A | Discharge status: Home / Self Care | Payer: Medicare Other | Source: Ambulatory Visit | Attending: Internal Medicine | Admitting: Internal Medicine

## 2022-02-11 DIAGNOSIS — I708 Atherosclerosis of other arteries: Secondary | ICD-10-CM | POA: Insufficient documentation

## 2022-02-11 DIAGNOSIS — H53122 Transient visual loss, left eye: Secondary | ICD-10-CM

## 2022-02-11 DIAGNOSIS — H3509 Other intraretinal microvascular abnormalities: Secondary | ICD-10-CM | POA: Insufficient documentation

## 2022-02-11 DIAGNOSIS — H539 Unspecified visual disturbance: Secondary | ICD-10-CM | POA: Diagnosis present

## 2022-02-13 ENCOUNTER — Encounter: Payer: Self-pay | Admitting: Nurse Practitioner

## 2022-02-13 NOTE — Telephone Encounter (Signed)
Left message for pt to call.

## 2022-02-13 NOTE — Telephone Encounter (Signed)
Patient is calling for test results. °

## 2022-02-16 ENCOUNTER — Telehealth: Payer: Self-pay | Admitting: Cardiology

## 2022-02-16 ENCOUNTER — Ambulatory Visit (HOSPITAL_COMMUNITY): Payer: Medicare Other | Attending: Cardiology

## 2022-02-16 DIAGNOSIS — H3509 Other intraretinal microvascular abnormalities: Secondary | ICD-10-CM | POA: Insufficient documentation

## 2022-02-16 DIAGNOSIS — I708 Atherosclerosis of other arteries: Secondary | ICD-10-CM | POA: Diagnosis present

## 2022-02-16 DIAGNOSIS — E785 Hyperlipidemia, unspecified: Secondary | ICD-10-CM | POA: Diagnosis present

## 2022-02-16 DIAGNOSIS — I25118 Atherosclerotic heart disease of native coronary artery with other forms of angina pectoris: Secondary | ICD-10-CM | POA: Diagnosis present

## 2022-02-16 DIAGNOSIS — I6523 Occlusion and stenosis of bilateral carotid arteries: Secondary | ICD-10-CM

## 2022-02-16 DIAGNOSIS — I10 Essential (primary) hypertension: Secondary | ICD-10-CM | POA: Diagnosis present

## 2022-02-16 DIAGNOSIS — H539 Unspecified visual disturbance: Secondary | ICD-10-CM | POA: Insufficient documentation

## 2022-02-16 DIAGNOSIS — R001 Bradycardia, unspecified: Secondary | ICD-10-CM | POA: Insufficient documentation

## 2022-02-16 LAB — ECHOCARDIOGRAM COMPLETE
Area-P 1/2: 3.21 cm2
S' Lateral: 3.2 cm

## 2022-02-16 NOTE — Telephone Encounter (Signed)
Patient's wife is returning phone call.  °

## 2022-02-16 NOTE — Telephone Encounter (Signed)
Joylene Grapes, NP  02/13/2022  8:17 AM EDT     Recent carotid Doppler study showed mild narrowing in the right carotid artery and moderate narrowing in the left carotid artery.  We will continue to monitor this and plan on a follow-up study in 1 year.  For now, continue current medications and follow-up as planned.  Thank you. -EM    Spoke with patient about carotid doppler results. Repeat study for 1 year ordered  He is asking about what may have caused plaque to break off and go to retinal artery. Will send to [facility] NP

## 2022-02-17 ENCOUNTER — Telehealth: Payer: Self-pay

## 2022-02-17 NOTE — Telephone Encounter (Signed)
Spoke with pt. Pt was notified of doppler & echocardiogram results. Pt will continue his current medications and f/u as planned.

## 2022-02-17 NOTE — Telephone Encounter (Signed)
MyChart message sent to patient with advise from [facility] NP (copied below)  Unfortunately, we do not have an explanation as to why this happened. All we can do at this point is continue to work on secondary prevention. Thank you

## 2022-02-18 NOTE — Telephone Encounter (Signed)
This encounter was created in error - please disregard.

## 2022-05-28 LAB — LAB REPORT - SCANNED
A1c: 5.4
EGFR: 67
PSA, Total: 1.26

## 2022-10-28 NOTE — Progress Notes (Unsigned)
[name] Date of Birth: May 26, 1946 Medical Record #[id number]  History of Present Illness: Mr. Friis is seen for follow up CAD.  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 Post bypass his ejection fraction was normal at 60%. Other issues include depression, gout, HLD and HTN.  In February 2015 he had an abnormal stress echo - was referred on for cardiac cath. This demonstrated patent grafts to the diagonal and LAD. The graft to the OM was occluded. This vessel was small and medical therapy recommended. Native RCA was patent. EF normal.  On a prior visit we stopped his beta blocker due to bradycardia. ACEi was switched to losartan due to persistent cough. His cough did resolve.   He was seen by Bernadene Person NP in July for finding of retinal plaque. LpA was normal. 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 Echo was unremarkable.   On follow up today he is doing very well. Denies any chest pain, dyspnea, palpitations. He is not as active. Still mourning the loss of his brother Lollie Sails. He has lost 10 lbs. He does have a history of bad nightmares.  Current Outpatient Medications  Medication Sig Dispense Refill   allopurinol (ZYLOPRIM) 300 MG tablet Take 300 mg by mouth every morning.      aspirin EC 81 MG tablet Take 81 mg by mouth every morning.     atorvastatin (LIPITOR) 20 MG tablet TAKE 1 TABLET (20 MG TOTAL) BY MOUTH DAILY AT 6 PM. 90 tablet 3   losartan (COZAAR) 50 MG tablet Take 50 mg by mouth daily.     nitroGLYCERIN (NITROSTAT) 0.4 MG SL tablet Place 1 tablet (0.4 mg total) under the tongue every 5 (five) minutes as needed for chest pain. 25 tablet 1   sildenafil (VIAGRA) 100 MG tablet Take 1 tablet (100 mg total) by mouth daily as needed for erectile dysfunction. 10 tablet 3   traZODone (DESYREL) 50 MG tablet Take by mouth daily.     No current  facility-administered medications for this visit.    Allergies  Allergen Reactions   Lisinopril Cough    Past Medical History:  Diagnosis Date   Anterolateral myocardial infarction (HCC) 08/19/10   STEMI   CAD (coronary artery disease)    Depression    Fracture    MULTIPLE   Gout    LV dysfunction     Past Surgical History:  Procedure Laterality Date   CARDIAC CATHETERIZATION     CORONARY ARTERY BYPASS GRAFT     LEFT HEART CATHETERIZATION WITH CORONARY/GRAFT ANGIOGRAM N/A 09/01/2013   Procedure: LEFT HEART CATHETERIZATION WITH Isabel Caprice;  Surgeon: Elen Acero M [place], MD;  Location: [facility] CATH LAB;  Service: Cardiovascular;  Laterality: N/A;   Multiple fractures     TONSILLECTOMY      Social History   Tobacco Use  Smoking Status Former   Packs/day: 0.50   Years: 15.00   Pack years: 7.50   Types: Cigarettes   Quit date: 07/20/1968   Years since quitting: 53.0  Smokeless Tobacco Never    Social History   Substance and Sexual Activity  Alcohol Use Yes   Alcohol/week: 3.0 standard drinks   Types: 3 Cans of beer per week   Comment: OCCASSIONALY    Family History  Problem Relation Age of Onset   Heart disease Father  Review of Systems: The review of systems is per the HPI.  All other systems were reviewed and are negative.  Physical Exam: BP 120/60   Pulse (!) 52   Ht 5' 10.5" (1.791 m)   Wt 177 lb 6.4 oz (80.5 kg)   SpO2 97%   BMI 25.09 kg/m  GENERAL:  Well appearing WM in NAD HEENT:  PERRL, EOMI, sclera are clear. Oropharynx is clear. NECK:  No jugular venous distention, carotid upstroke brisk and symmetric, no bruits, no thyromegaly or adenopathy LUNGS:  Clear to auscultation bilaterally CHEST:  Unremarkable HEART:  RRR,  PMI not displaced or sustained,S1 and S2 within normal limits, no S3, no S4: no clicks, no rubs, no murmurs ABD:  Soft, nontender. BS +, no masses or bruits. No hepatomegaly, no splenomegaly EXT:  2 + pulses throughout,  no edema, no cyanosis no clubbing SKIN:  Warm and dry.  No rashes NEURO:  Alert and oriented x 3. Cranial nerves II through XII intact. PSYCH:  Cognitively intact    LABORATORY DATA:   Lab Results  Component Value Date   WBC 6.8 08/30/2013   HGB 14.8 08/30/2013   HCT 44.5 08/30/2013   PLT 199.0 08/30/2013   GLUCOSE 97 08/30/2013   CHOL 105 10/04/2013   TRIG 169.0 (H) 10/04/2013   HDL 28.90 (L) 10/04/2013   LDLDIRECT 54.7 11/10/2012   LDLCALC 42 10/04/2013   ALT 18 10/04/2013   AST 18 10/04/2013   NA 141 08/30/2013   K 4.7 08/30/2013   CL 106 08/30/2013   CREATININE 1.1 08/30/2013   BUN 15 08/30/2013   CO2 29 08/30/2013   TSH 1.536 08/19/2010   INR 1.1 (H) 08/30/2013   HGBA1C  08/19/2010    5.3 (NOTE)                                                                       According to the ADA Clinical Practice Recommendations for 2011, when HbA1c is used as a screening test:   >=6.5%   Diagnostic of Diabetes Mellitus           (if abnormal result  is confirmed)  5.7-6.4%   Increased risk of developing Diabetes Mellitus  References:Diagnosis and Classification of Diabetes Mellitus,Diabetes Care,2011,34(Suppl 1):S62-S69 and Standards of Medical Care in         Diabetes - 2011,Diabetes Care,2011,34  (Suppl 1):S11-S61.   Labs reviewed from Dr. Su Hiltoberts in October 2016. Glucose 105, A1c 5.8%. Other chemistries, CBC and TFTs normal. Cholesterol 124, triglycerides 167, HDL 33, LDL 58.  Dated 05/17/18: CBC, CMET, TSH normal. Cholesterol 127, triglycerides 126, HDL 40, LDL 62. A1c 5.3%  Ecg today shows sinus brady with HR 52. LAD. No acute changes. I have personally reviewed and interpreted this study.  Echo 02/16/22: IMPRESSIONS     1. Left ventricular ejection fraction, by estimation, is 55 to 60%. The  left ventricle has normal function. The left ventricle has no regional  wall motion abnormalities. Left ventricular diastolic parameters were  normal.   2. Right ventricular  systolic function is normal. The right ventricular  size is mildly enlarged.   3. Left atrial size was mildly dilated.   4. Right atrial size was mildly dilated.  5. The mitral valve is normal in structure. Mild mitral valve  regurgitation. No evidence of mitral stenosis.   6. The aortic valve is normal in structure. Aortic valve regurgitation is  not visualized. No aortic stenosis is present.   7. The inferior vena cava is normal in size with greater than 50%  respiratory variability, suggesting right atrial pressure of 3 mmHg.   Assessment / Plan: 1. CAD - prior MI with CABG - s/p cardiac cath Feb 2015 noted above- will continue with medical management. Stable class 0-1 angina. Continue with CV risk factor modification. On ASA and statin therapy. Follow up in one year  2. HLD - On statin. Labs followed by Dr Su Hilt.   3. HTN - well controlled.  Continue losartan.  I will follow up in one year

## 2022-10-29 ENCOUNTER — Encounter: Payer: Self-pay | Admitting: Cardiology

## 2022-10-29 ENCOUNTER — Ambulatory Visit: Payer: Medicare Other | Attending: Cardiology | Admitting: Cardiology

## 2022-10-29 ENCOUNTER — Ambulatory Visit: Payer: BC Managed Care – PPO | Admitting: Cardiology

## 2022-10-29 VITALS — BP 122/76 | HR 74 | Ht 70.5 in | Wt 162.0 lb

## 2022-10-29 DIAGNOSIS — I708 Atherosclerosis of other arteries: Secondary | ICD-10-CM | POA: Diagnosis present

## 2022-10-29 DIAGNOSIS — H3509 Other intraretinal microvascular abnormalities: Secondary | ICD-10-CM | POA: Insufficient documentation

## 2022-10-29 DIAGNOSIS — I25118 Atherosclerotic heart disease of native coronary artery with other forms of angina pectoris: Secondary | ICD-10-CM | POA: Diagnosis present

## 2022-10-29 DIAGNOSIS — E785 Hyperlipidemia, unspecified: Secondary | ICD-10-CM | POA: Diagnosis present

## 2022-10-29 DIAGNOSIS — I10 Essential (primary) hypertension: Secondary | ICD-10-CM | POA: Diagnosis present

## 2022-10-29 DIAGNOSIS — I6523 Occlusion and stenosis of bilateral carotid arteries: Secondary | ICD-10-CM | POA: Insufficient documentation

## 2022-10-29 NOTE — Patient Instructions (Signed)
Medication Instructions:  NO CHANGES  *If you need a refill on your cardiac medications before your next appointment, please call your pharmacy*   Testing/Procedures: Carotid Doppler -- Due July 2024   Follow-Up: At [facility], you and your health needs are our priority.  As part of our continuing mission to provide you with exceptional heart care, we have created designated Provider Care Teams.  These Care Teams include your primary Cardiologist (physician) and Advanced Practice Providers (APPs -  Physician Assistants and Nurse Practitioners) who all work together to provide you with the care you need, when you need it.  We recommend signing up for the patient portal called "MyChart".  Sign up information is provided on this After Visit Summary.  MyChart is used to connect with patients for Virtual Visits (Telemedicine).  Patients are able to view lab/test results, encounter notes, upcoming appointments, etc.  Non-urgent messages can be sent to your provider as well.   To learn more about what you can do with MyChart, go to [email or web address].    Your next appointment:    12 months with Dr. [place]

## 2022-11-11 ENCOUNTER — Ambulatory Visit (HOSPITAL_COMMUNITY)
Admission: RE | Admit: 2022-11-11 | Discharge: 2022-11-11 | Disposition: A | Discharge status: Home / Self Care | Payer: Medicare Other | Source: Ambulatory Visit | Attending: Internal Medicine | Admitting: Internal Medicine

## 2022-11-11 DIAGNOSIS — I6523 Occlusion and stenosis of bilateral carotid arteries: Secondary | ICD-10-CM

## 2023-01-27 ENCOUNTER — Encounter (INDEPENDENT_AMBULATORY_CARE_PROVIDER_SITE_OTHER): Payer: Medicare Other | Admitting: Ophthalmology

## 2023-02-26 ENCOUNTER — Encounter (INDEPENDENT_AMBULATORY_CARE_PROVIDER_SITE_OTHER): Payer: Medicare Other | Admitting: Ophthalmology

## 2023-02-26 DIAGNOSIS — I10 Essential (primary) hypertension: Secondary | ICD-10-CM | POA: Diagnosis not present

## 2023-02-26 DIAGNOSIS — H33102 Unspecified retinoschisis, left eye: Secondary | ICD-10-CM | POA: Diagnosis not present

## 2023-02-26 DIAGNOSIS — H33301 Unspecified retinal break, right eye: Secondary | ICD-10-CM | POA: Diagnosis not present

## 2023-02-26 DIAGNOSIS — H2513 Age-related nuclear cataract, bilateral: Secondary | ICD-10-CM

## 2023-02-26 DIAGNOSIS — H35033 Hypertensive retinopathy, bilateral: Secondary | ICD-10-CM | POA: Diagnosis not present

## 2023-02-26 DIAGNOSIS — H34212 Partial retinal artery occlusion, left eye: Secondary | ICD-10-CM

## 2023-02-26 DIAGNOSIS — H43813 Vitreous degeneration, bilateral: Secondary | ICD-10-CM

## 2024-02-28 ENCOUNTER — Encounter (INDEPENDENT_AMBULATORY_CARE_PROVIDER_SITE_OTHER): Payer: Medicare Other | Admitting: Ophthalmology
